# Patient Record
Sex: Female | Born: 1970
Health system: Southern US, Community
[De-identification: ages and names within clinical notes are randomized; demographics above are authoritative.]

## PROBLEM LIST (undated history)

## (undated) DIAGNOSIS — N83209 Unspecified ovarian cyst, unspecified side: Secondary | ICD-10-CM

## (undated) DIAGNOSIS — N809 Endometriosis, unspecified: Secondary | ICD-10-CM

## (undated) DIAGNOSIS — IMO0002 Reserved for concepts with insufficient information to code with codable children: Secondary | ICD-10-CM

## (undated) DIAGNOSIS — D219 Benign neoplasm of connective and other soft tissue, unspecified: Secondary | ICD-10-CM

## (undated) DIAGNOSIS — D649 Anemia, unspecified: Secondary | ICD-10-CM

## (undated) DIAGNOSIS — R87619 Unspecified abnormal cytological findings in specimens from cervix uteri: Secondary | ICD-10-CM

## (undated) DIAGNOSIS — I10 Essential (primary) hypertension: Secondary | ICD-10-CM

## (undated) HISTORY — PX: WISDOM TOOTH EXTRACTION: SHX21

## (undated) HISTORY — DX: Essential (primary) hypertension: I10

## (undated) HISTORY — PX: HYSTERECTOMY, TOTAL, LAPAROSCOPIC, ROBOT-ASSISTED WITH SALPINGECTOMY: SHX7587

---

## 2003-12-01 HISTORY — PX: DIAGNOSTIC LAPAROSCOPY: SUR761

## 2004-06-30 HISTORY — PX: OTHER SURGICAL HISTORY: SHX169

## 2006-09-29 ENCOUNTER — Encounter: Admission: RE | Admit: 2006-09-29 | Discharge: 2006-12-28 | Payer: Self-pay | Admitting: Obstetrics and Gynecology

## 2007-07-07 ENCOUNTER — Ambulatory Visit (HOSPITAL_COMMUNITY): Admission: RE | Admit: 2007-07-07 | Discharge: 2007-07-07 | Payer: Self-pay | Admitting: Obstetrics and Gynecology

## 2012-08-23 ENCOUNTER — Other Ambulatory Visit: Payer: Self-pay | Admitting: Obstetrics and Gynecology

## 2012-08-23 DIAGNOSIS — Z1231 Encounter for screening mammogram for malignant neoplasm of breast: Secondary | ICD-10-CM

## 2012-09-14 ENCOUNTER — Ambulatory Visit: Payer: Self-pay

## 2012-11-18 ENCOUNTER — Ambulatory Visit
Admission: RE | Admit: 2012-11-18 | Discharge: 2012-11-18 | Disposition: A | Discharge status: Home / Self Care | Payer: Self-pay | Source: Ambulatory Visit | Attending: Obstetrics and Gynecology | Admitting: Obstetrics and Gynecology

## 2012-11-18 DIAGNOSIS — Z1231 Encounter for screening mammogram for malignant neoplasm of breast: Secondary | ICD-10-CM

## 2013-07-23 ENCOUNTER — Emergency Department (HOSPITAL_COMMUNITY): Payer: Commercial Managed Care - PPO

## 2013-07-23 ENCOUNTER — Emergency Department (HOSPITAL_COMMUNITY)
Admission: EM | Admit: 2013-07-23 | Discharge: 2013-07-24 | Disposition: A | Discharge status: Home / Self Care | Payer: Commercial Managed Care - PPO | Attending: Emergency Medicine | Admitting: Emergency Medicine

## 2013-07-23 ENCOUNTER — Encounter (HOSPITAL_COMMUNITY): Payer: Self-pay | Admitting: *Deleted

## 2013-07-23 DIAGNOSIS — D219 Benign neoplasm of connective and other soft tissue, unspecified: Secondary | ICD-10-CM

## 2013-07-23 DIAGNOSIS — D259 Leiomyoma of uterus, unspecified: Secondary | ICD-10-CM | POA: Insufficient documentation

## 2013-07-23 HISTORY — DX: Unspecified abnormal cytological findings in specimens from cervix uteri: R87.619

## 2013-07-23 HISTORY — DX: Reserved for concepts with insufficient information to code with codable children: IMO0002

## 2013-07-23 LAB — CBC WITH DIFFERENTIAL/PLATELET
Basophils Absolute: 0 10*3/uL (ref 0.0–0.1)
Basophils Relative: 0 % (ref 0–1)
Eosinophils Absolute: 0.1 10*3/uL (ref 0.0–0.7)
Eosinophils Relative: 1 % (ref 0–5)
HCT: 29 % — ABNORMAL LOW (ref 36.0–46.0)
Hemoglobin: 8.4 g/dL — ABNORMAL LOW (ref 12.0–15.0)
Lymphocytes Relative: 10 % — ABNORMAL LOW (ref 12–46)
Lymphs Abs: 1 10*3/uL (ref 0.7–4.0)
MCH: 18.7 pg — ABNORMAL LOW (ref 26.0–34.0)
MCHC: 29 g/dL — ABNORMAL LOW (ref 30.0–36.0)
MCV: 64.4 fL — ABNORMAL LOW (ref 78.0–100.0)
Monocytes Absolute: 0.6 10*3/uL (ref 0.1–1.0)
Monocytes Relative: 6 % (ref 3–12)
Neutro Abs: 8.5 10*3/uL — ABNORMAL HIGH (ref 1.7–7.7)
Neutrophils Relative %: 83 % — ABNORMAL HIGH (ref 43–77)
Platelets: 274 10*3/uL (ref 150–400)
RBC: 4.5 MIL/uL (ref 3.87–5.11)
RDW: 25.2 % — ABNORMAL HIGH (ref 11.5–15.5)
WBC: 10.2 10*3/uL (ref 4.0–10.5)

## 2013-07-23 LAB — COMPREHENSIVE METABOLIC PANEL
ALT: 138 U/L — ABNORMAL HIGH (ref 0–35)
AST: 149 U/L — ABNORMAL HIGH (ref 0–37)
Albumin: 3.7 g/dL (ref 3.5–5.2)
Alkaline Phosphatase: 87 U/L (ref 39–117)
BUN: 8 mg/dL (ref 6–23)
CO2: 25 mEq/L (ref 19–32)
Calcium: 8.9 mg/dL (ref 8.4–10.5)
Chloride: 105 mEq/L (ref 96–112)
Creatinine, Ser: 0.59 mg/dL (ref 0.50–1.10)
GFR calc Af Amer: >90 mL/min (ref >90)
GFR calc non Af Amer: >90 mL/min (ref >90)
Glucose, Bld: 87 mg/dL (ref 70–99)
Potassium: 3.1 mEq/L — ABNORMAL LOW (ref 3.5–5.1)
Sodium: 139 mEq/L (ref 135–145)
Total Bilirubin: 0.7 mg/dL (ref 0.3–1.2)
Total Protein: 6.9 g/dL (ref 6.0–8.3)

## 2013-07-23 LAB — URINALYSIS, ROUTINE W REFLEX MICROSCOPIC
Bilirubin Urine: NEGATIVE
Glucose, UA: NEGATIVE mg/dL
Ketones, ur: NEGATIVE mg/dL
Leukocytes, UA: NEGATIVE
Nitrite: NEGATIVE
Protein, ur: 30 mg/dL — AB
Specific Gravity, Urine: 1.024 (ref 1.005–1.030)
Urobilinogen, UA: 4 mg/dL — ABNORMAL HIGH (ref 0.0–1.0)
pH: 8 (ref 5.0–8.0)

## 2013-07-23 LAB — URINE MICROSCOPIC-ADD ON

## 2013-07-23 MED ORDER — ONDANSETRON HCL 4 MG/2ML IJ SOLN
4.0000 mg | Freq: Once | INTRAMUSCULAR | Status: AC
  Administered 2013-07-23: 4 mg via INTRAVENOUS
  Filled 2013-07-23: qty 2

## 2013-07-23 MED ORDER — SODIUM CHLORIDE 0.9 % IV BOLUS (SEPSIS)
1000.0000 mL | Freq: Once | INTRAVENOUS | Status: AC
  Administered 2013-07-23: 1000 mL via INTRAVENOUS

## 2013-07-23 MED ORDER — MORPHINE SULFATE 4 MG/ML IJ SOLN
4.0000 mg | Freq: Once | INTRAMUSCULAR | Status: AC
  Administered 2013-07-23: 4 mg via INTRAVENOUS
  Filled 2013-07-23: qty 1

## 2013-07-23 NOTE — ED Notes (Signed)
Pt states that she has fibriods and when her cycle is starting she has abdominal pain. Pt is on cycle now. Pt states that the abdominal pain is more severe and generalized. Pt states she is usually nauseated and vomits on her cycle as well, which has been occuring with this cycle.

## 2013-07-23 NOTE — ED Provider Notes (Signed)
CSN: [id number]     Arrival date & time 07/23/13  2006 History     First MD Initiated Contact with Patient 07/23/13 2135     Chief Complaint  Patient presents with  . Abdominal Pain   (Consider location/radiation/quality/duration/timing/severity/associated sxs/prior Treatment) HPI Comments: Patient presents emergency department with chief complaint of abdominal pain. She states that she has uterine fibroids, which caused her to have a lot of abdominal pain, but states that this month it is much much worse than normal. She states that she has had large menstruation, and uses 16-18 pads per day. States that the pain is moderate to severe. She states that she is usually anemic. He states it feels that there is a ball in her lower abdomen. Nothing makes her symptoms better or worse.  The history is provided by the patient. No language interpreter was used.    Past Medical History  Diagnosis Date  . Fibroid   . Abnormal Pap smear    History reviewed. No pertinent past surgical history. History reviewed. No pertinent family history. History  Substance Use Topics  . Smoking status: Never Smoker   . Smokeless tobacco: Not on file  . Alcohol Use: No   OB History   Grav Para Term Preterm Abortions TAB SAB Ect Mult Living                 Review of Systems  All other systems reviewed and are negative.    Allergies  Review of patient's allergies indicates no known allergies.  Home Medications  No current outpatient prescriptions on file. BP 122/82  Pulse 117  Temp(Src) 98.7 F (37.1 C) (Oral)  Resp 16  SpO2 100% Physical Exam  Nursing note and vitals reviewed. Constitutional: She is oriented to person, place, and time. She appears well-developed and well-nourished.  HENT:  Head: Normocephalic and atraumatic.  Eyes: Conjunctivae and EOM are normal. Pupils are equal, round, and reactive to light.  Neck: Normal range of motion. Neck supple.  Cardiovascular: Normal rate and  regular rhythm.  Exam reveals no gallop and no friction rub.   No murmur heard. Pulmonary/Chest: Effort normal and breath sounds normal. No respiratory distress. She has no wheezes. She has no rales. She exhibits no tenderness.  Abdominal: Soft. Bowel sounds are normal. She exhibits no distension and no mass. There is no tenderness. There is no rebound and no guarding. Hernia confirmed negative in the right inguinal area and confirmed negative in the left inguinal area.  Genitourinary: No labial fusion. There is no rash, tenderness, lesion or injury on the right labia. There is no rash, tenderness, lesion or injury on the left labia. Uterus is tender. Uterus is not deviated, not enlarged and not fixed. Cervix exhibits no motion tenderness, no discharge and no friability. Right adnexum displays no mass, no tenderness and no fullness. Left adnexum displays no mass, no tenderness and no fullness. No erythema, tenderness or bleeding around the vagina. No foreign body around the vagina. No signs of injury around the vagina. No vaginal discharge found.  Chaperone was present during exam. Moderate amount of dark red blood, no evidence of hemorrhage   Musculoskeletal: Normal range of motion. She exhibits no edema and no tenderness.  Lymphadenopathy:       Right: No inguinal adenopathy present.       Left: No inguinal adenopathy present.  Neurological: She is alert and oriented to person, place, and time.  Skin: Skin is warm and dry.  Psychiatric: She  has a normal mood and affect. Her behavior is normal. Judgment and thought content normal.    ED Course   Procedures (including critical care time)  Labs Reviewed  CBC WITH DIFFERENTIAL - Abnormal; Notable for the following:    Hemoglobin 8.4 (*)    HCT 29.0 (*)    MCV 64.4 (*)    MCH 18.7 (*)    MCHC 29.0 (*)    RDW 25.2 (*)    Neutrophils Relative % 83 (*)    Lymphocytes Relative 10 (*)    Neutro Abs 8.5 (*)    All other components within normal  limits  COMPREHENSIVE METABOLIC PANEL - Abnormal; Notable for the following:    Potassium 3.1 (*)    AST 149 (*)    ALT 138 (*)    All other components within normal limits  WET PREP, GENITAL  GC/CHLAMYDIA PROBE AMP  URINALYSIS, ROUTINE W REFLEX MICROSCOPIC   Results for orders placed during the hospital encounter of 07/23/13  WET PREP, GENITAL      Result Value Range   Yeast Wet Prep HPF POC NONE SEEN  NONE SEEN   Trich, Wet Prep NONE SEEN  NONE SEEN   Clue Cells Wet Prep HPF POC FEW (*) NONE SEEN   WBC, Wet Prep HPF POC FEW (*) NONE SEEN  CBC WITH DIFFERENTIAL      Result Value Range   WBC 10.2  4.0 - 10.5 K/uL   RBC 4.50  3.87 - 5.11 MIL/uL   Hemoglobin 8.4 (*) 12.0 - 15.0 g/dL   HCT 16.1 (*) 09.6 - 04.5 %   MCV 64.4 (*) 78.0 - 100.0 fL   MCH 18.7 (*) 26.0 - 34.0 pg   MCHC 29.0 (*) 30.0 - 36.0 g/dL   RDW 40.9 (*) 81.1 - 91.4 %   Platelets 274  150 - 400 K/uL   Neutrophils Relative % 83 (*) 43 - 77 %   Lymphocytes Relative 10 (*) 12 - 46 %   Monocytes Relative 6  3 - 12 %   Eosinophils Relative 1  0 - 5 %   Basophils Relative 0  0 - 1 %   Neutro Abs 8.5 (*) 1.7 - 7.7 K/uL   Lymphs Abs 1.0  0.7 - 4.0 K/uL   Monocytes Absolute 0.6  0.1 - 1.0 K/uL   Eosinophils Absolute 0.1  0.0 - 0.7 K/uL   Basophils Absolute 0.0  0.0 - 0.1 K/uL   RBC Morphology ELLIPTOCYTES    COMPREHENSIVE METABOLIC PANEL      Result Value Range   Sodium 139  135 - 145 mEq/L   Potassium 3.1 (*) 3.5 - 5.1 mEq/L   Chloride 105  96 - 112 mEq/L   CO2 25  19 - 32 mEq/L   Glucose, Bld 87  70 - 99 mg/dL   BUN 8  6 - 23 mg/dL   Creatinine, Ser 7.82  0.50 - 1.10 mg/dL   Calcium 8.9  8.4 - 95.6 mg/dL   Total Protein 6.9  6.0 - 8.3 g/dL   Albumin 3.7  3.5 - 5.2 g/dL   AST 213 (*) 0 - 37 U/L   ALT 138 (*) 0 - 35 U/L   Alkaline Phosphatase 87  39 - 117 U/L   Total Bilirubin 0.7  0.3 - 1.2 mg/dL   GFR calc non Af Amer >90  >90 mL/min   GFR calc Af Amer >90  >90 mL/min  URINALYSIS, ROUTINE W REFLEX  MICROSCOPIC  Result Value Range   Color, Urine YELLOW  YELLOW   APPearance CLOUDY (*) CLEAR   Specific Gravity, Urine 1.024  1.005 - 1.030   pH 8.0  5.0 - 8.0   Glucose, UA NEGATIVE  NEGATIVE mg/dL   Hgb urine dipstick LARGE (*) NEGATIVE   Bilirubin Urine NEGATIVE  NEGATIVE   Ketones, ur NEGATIVE  NEGATIVE mg/dL   Protein, ur 30 (*) NEGATIVE mg/dL   Urobilinogen, UA 4.0 (*) 0.0 - 1.0 mg/dL   Nitrite NEGATIVE  NEGATIVE   Leukocytes, UA NEGATIVE  NEGATIVE  URINE MICROSCOPIC-ADD ON      Result Value Range   Squamous Epithelial / LPF MANY (*) RARE   WBC, UA 3-6  <3 WBC/hpf   RBC / HPF 3-6  <3 RBC/hpf   Bacteria, UA MANY (*) RARE   Casts HYALINE CASTS (*) NEGATIVE   Urine-Other MUCOUS PRESENT     US Transvaginal Non-ob  07/24/2013   *RADIOLOGY REPORT*  Clinical Data: Pelvic pain  TRANSABDOMINAL AND TRANSVAGINAL ULTRASOUND OF PELVIS Technique:  Both transabdominal and transvaginal ultrasound examinations of the pelvis were performed. Transabdominal technique was performed for global imaging of the pelvis including uterus, ovaries, adnexal regions, and pelvic cul-de-sac.  It was necessary to proceed with endovaginal exam following the transabdominal exam to visualize the endometrium and adnexa.  Comparison:  None  Findings:  Uterus:  Mildly enlarged at 10.0 x 6.6 x 8.8 cm.  There are multiple fibroids, the largest of which is exophytic from the posterior margin measuring 4.3 x 3.5 x 4.1 cm.  There are several intramural fibroids.  No submucosal fibroid visualized.  Endometrium: Debris/fluid within the endometrium.  Thickness measures up to 8 mm.  Right ovary:  Measures 5.3 x 3.1 x 4.4 cm.  The dominant cyst measuring 2.5 x 3.8 cm is mildly complex with internal hypoechoic fluid and echoes and hematocrit level on image 76/88.  Left ovary: Measures 4.7 x 3.3 x 3.5 cm by transabdominal technique.  Not visualized by transvaginal technique.  Other findings: Small amount of free fluid, predominately  within the right adnexa.  IMPRESSION: Fibroid uterus.  Probable hemorrhagic cyst right ovary. Small amount of free fluid predominately collects within the right adnexa, therefore may reflect cyst rupture.   Original Report Authenticated By: Jearld Lesch, M.D.   US Pelvis Complete  07/24/2013   *RADIOLOGY REPORT*  Clinical Data: Pelvic pain  TRANSABDOMINAL AND TRANSVAGINAL ULTRASOUND OF PELVIS Technique:  Both transabdominal and transvaginal ultrasound examinations of the pelvis were performed. Transabdominal technique was performed for global imaging of the pelvis including uterus, ovaries, adnexal regions, and pelvic cul-de-sac.  It was necessary to proceed with endovaginal exam following the transabdominal exam to visualize the endometrium and adnexa.  Comparison:  None  Findings:  Uterus:  Mildly enlarged at 10.0 x 6.6 x 8.8 cm.  There are multiple fibroids, the largest of which is exophytic from the posterior margin measuring 4.3 x 3.5 x 4.1 cm.  There are several intramural fibroids.  No submucosal fibroid visualized.  Endometrium: Debris/fluid within the endometrium.  Thickness measures up to 8 mm.  Right ovary:  Measures 5.3 x 3.1 x 4.4 cm.  The dominant cyst measuring 2.5 x 3.8 cm is mildly complex with internal hypoechoic fluid and echoes and hematocrit level on image 76/88.  Left ovary: Measures 4.7 x 3.3 x 3.5 cm by transabdominal technique.  Not visualized by transvaginal technique.  Other findings: Small amount of free fluid, predominately within the right adnexa.  IMPRESSION: Fibroid uterus.  Probable hemorrhagic cyst right ovary. Small amount of free fluid predominately collects within the right adnexa, therefore may reflect cyst rupture.   Original Report Authenticated By: Jearld Lesch, M.D.     No results found. 1. Fibroids     MDM  Patient with heavy menstrual bleeding, and abdominal pain. She has a history of uterine fibroids. Will order basic labs, we'll perform pelvic, and  will reevaluate.  Patient discussed with Dr. Micheline Maze.  Pelvic exam reveals moderate dark red blood, no evidence of hemorrhage or active bleeding. Patient is moderately tender to the middle low abdomen. Suspect that the symptoms are stemming from the uterine fibroids. Will order ultrasound to evaluate further.  Patient states that the pain is well controlled.  Plan is to discharge the patient pending [place].  Patient is not dizzy.  She states that she suffers from chronic anemia.  Recommend iron supplement.  Patient will follow-up with her OBGYN.  The patient understands and agrees with the plan.    Workup and treatment discussed with Dr. Micheline Maze, who agrees with the plan.  Patient does not want to try OCPs at this time, but will ask OBGYN about it.  1:19 AM Discussed [place] results with Dr. Shela Commons, who recommends NSAIDs and OBGYN follow-up.  Roxy Horseman, PA-C 07/24/13 0110  Roxy Horseman, PA-C 07/24/13 0120

## 2013-07-24 LAB — WET PREP, GENITAL
Trich, Wet Prep: NONE SEEN
Yeast Wet Prep HPF POC: NONE SEEN

## 2013-07-24 MED ORDER — FERROUS SULFATE 325 (65 FE) MG PO TABS: 325.0000 mg | ORAL_TABLET | Freq: Every day | ORAL | Status: DC

## 2013-07-24 MED ORDER — HYDROCODONE-ACETAMINOPHEN 5-325 MG PO TABS: 1.0000 | ORAL_TABLET | Freq: Four times a day (QID) | ORAL | Status: DC | PRN

## 2013-07-24 MED ORDER — MORPHINE SULFATE 4 MG/ML IJ SOLN
4.0000 mg | Freq: Once | INTRAMUSCULAR | Status: AC
  Administered 2013-07-24: 4 mg via INTRAVENOUS
  Filled 2013-07-24: qty 1

## 2013-07-24 MED ORDER — IBUPROFEN 600 MG PO TABS: 600.0000 mg | ORAL_TABLET | Freq: Four times a day (QID) | ORAL | Status: DC | PRN

## 2013-07-24 MED ORDER — SODIUM CHLORIDE 0.9 % IV BOLUS (SEPSIS)
1000.0000 mL | Freq: Once | INTRAVENOUS | Status: AC
  Administered 2013-07-24: 1000 mL via INTRAVENOUS

## 2013-07-24 NOTE — ED Provider Notes (Signed)
Medical screening examination/treatment/procedure(s) were performed by non-physician practitioner and as supervising physician I was immediately available for consultation/collaboration.   Shanna Cisco, MD 07/24/13 1139

## 2013-07-25 LAB — URINE CULTURE: Colony Count: 30000

## 2013-07-25 LAB — GC/CHLAMYDIA PROBE AMP
CT Probe RNA: NEGATIVE
GC Probe RNA: NEGATIVE

## 2013-11-03 ENCOUNTER — Other Ambulatory Visit: Payer: Self-pay | Admitting: Obstetrics and Gynecology

## 2013-11-04 ENCOUNTER — Emergency Department (HOSPITAL_COMMUNITY)
Admission: EM | Admit: 2013-11-04 | Discharge: 2013-11-05 | Disposition: A | Discharge status: Home / Self Care | Payer: Commercial Managed Care - PPO | Attending: Emergency Medicine | Admitting: Emergency Medicine

## 2013-11-04 ENCOUNTER — Encounter (HOSPITAL_COMMUNITY): Payer: Self-pay | Admitting: Emergency Medicine

## 2013-11-04 DIAGNOSIS — R42 Dizziness and giddiness: Secondary | ICD-10-CM | POA: Insufficient documentation

## 2013-11-04 DIAGNOSIS — D259 Leiomyoma of uterus, unspecified: Secondary | ICD-10-CM | POA: Insufficient documentation

## 2013-11-04 DIAGNOSIS — D219 Benign neoplasm of connective and other soft tissue, unspecified: Secondary | ICD-10-CM

## 2013-11-04 DIAGNOSIS — Z3202 Encounter for pregnancy test, result negative: Secondary | ICD-10-CM | POA: Insufficient documentation

## 2013-11-04 DIAGNOSIS — R112 Nausea with vomiting, unspecified: Secondary | ICD-10-CM | POA: Insufficient documentation

## 2013-11-04 DIAGNOSIS — N83209 Unspecified ovarian cyst, unspecified side: Secondary | ICD-10-CM

## 2013-11-04 LAB — COMPREHENSIVE METABOLIC PANEL
ALT: 28 U/L (ref 0–35)
AST: 29 U/L (ref 0–37)
Albumin: 3.8 g/dL (ref 3.5–5.2)
Alkaline Phosphatase: 82 U/L (ref 39–117)
BUN: 8 mg/dL (ref 6–23)
CO2: 24 mEq/L (ref 19–32)
Calcium: 8.7 mg/dL (ref 8.4–10.5)
Chloride: 103 mEq/L (ref 96–112)
Creatinine, Ser: 0.57 mg/dL (ref 0.50–1.10)
GFR calc Af Amer: >90 mL/min (ref >90)
GFR calc non Af Amer: >90 mL/min (ref >90)
Glucose, Bld: 109 mg/dL — ABNORMAL HIGH (ref 70–99)
Potassium: 3.2 mEq/L — ABNORMAL LOW (ref 3.5–5.1)
Sodium: 138 mEq/L (ref 135–145)
Total Bilirubin: 0.3 mg/dL (ref 0.3–1.2)
Total Protein: 7.2 g/dL (ref 6.0–8.3)

## 2013-11-04 LAB — POCT PREGNANCY, URINE: Preg Test, Ur: NEGATIVE

## 2013-11-04 MED ORDER — MORPHINE SULFATE 4 MG/ML IJ SOLN
4.0000 mg | Freq: Once | INTRAMUSCULAR | Status: AC
  Administered 2013-11-05: 4 mg via INTRAVENOUS
  Filled 2013-11-04: qty 1

## 2013-11-04 MED ORDER — ONDANSETRON 4 MG PO TBDP
8.0000 mg | ORAL_TABLET | Freq: Once | ORAL | Status: AC
  Administered 2013-11-04: 8 mg via ORAL
  Filled 2013-11-04: qty 2

## 2013-11-04 MED ORDER — SODIUM CHLORIDE 0.9 % IV BOLUS (SEPSIS)
1000.0000 mL | Freq: Once | INTRAVENOUS | Status: AC
  Administered 2013-11-05: 1000 mL via INTRAVENOUS

## 2013-11-04 NOTE — ED Notes (Addendum)
C/o abd pain, also nv. Onset 1200 today. Has taken advil 400mg  and tylenol 1000mg  at 2030. Denies sx other than pain and nv. Last emesis PTA. Last BM today (normal), last ate 1500 (vomited). LMP onset today. Mentions has an ovarian cyst (?side). Has appt for 12/30 to deal with her fibroids/cysts.

## 2013-11-05 ENCOUNTER — Emergency Department (HOSPITAL_COMMUNITY): Payer: Commercial Managed Care - PPO

## 2013-11-05 LAB — URINALYSIS, ROUTINE W REFLEX MICROSCOPIC
Bilirubin Urine: NEGATIVE
Glucose, UA: NEGATIVE mg/dL
Ketones, ur: 40 mg/dL — AB
Nitrite: NEGATIVE
Protein, ur: NEGATIVE mg/dL
Specific Gravity, Urine: 1.022 (ref 1.005–1.030)
Urobilinogen, UA: 1 mg/dL (ref 0.0–1.0)
pH: 7.5 (ref 5.0–8.0)

## 2013-11-05 LAB — CBC WITH DIFFERENTIAL/PLATELET
Basophils Absolute: 0 10*3/uL (ref 0.0–0.1)
Basophils Relative: 0 % (ref 0–1)
Eosinophils Absolute: 0 10*3/uL (ref 0.0–0.7)
Eosinophils Relative: 0 % (ref 0–5)
HCT: 28.7 % — ABNORMAL LOW (ref 36.0–46.0)
Hemoglobin: 8.3 g/dL — ABNORMAL LOW (ref 12.0–15.0)
Lymphocytes Relative: 12 % (ref 12–46)
Lymphs Abs: 1.1 10*3/uL (ref 0.7–4.0)
MCH: 18.8 pg — ABNORMAL LOW (ref 26.0–34.0)
MCHC: 28.9 g/dL — ABNORMAL LOW (ref 30.0–36.0)
MCV: 65.1 fL — ABNORMAL LOW (ref 78.0–100.0)
Monocytes Absolute: 0.3 10*3/uL (ref 0.1–1.0)
Monocytes Relative: 3 % (ref 3–12)
Neutro Abs: 8.1 10*3/uL — ABNORMAL HIGH (ref 1.7–7.7)
Neutrophils Relative %: 85 % — ABNORMAL HIGH (ref 43–77)
Platelets: 275 10*3/uL (ref 150–400)
RBC: 4.41 MIL/uL (ref 3.87–5.11)
RDW: 22.3 % — ABNORMAL HIGH (ref 11.5–15.5)
WBC: 9.5 10*3/uL (ref 4.0–10.5)

## 2013-11-05 LAB — URINE MICROSCOPIC-ADD ON

## 2013-11-05 MED ORDER — HYDROCODONE-ACETAMINOPHEN 5-325 MG PO TABS: 1.0000 | ORAL_TABLET | ORAL | Status: DC | PRN

## 2013-11-05 MED ORDER — ONDANSETRON 4 MG PO TBDP: ORAL_TABLET | ORAL | Status: DC

## 2013-11-05 NOTE — ED Notes (Signed)
Pt taken to ultrasound

## 2013-11-05 NOTE — ED Notes (Signed)
Pt returned from Ultrasound.

## 2013-11-05 NOTE — ED Provider Notes (Signed)
CSN: [id number]     Arrival date & time 11/04/13  2140 History   First MD Initiated Contact with Patient 11/04/13 2335     Chief Complaint  Patient presents with  . Emesis  . Abdominal Pain   (Consider location/radiation/quality/duration/timing/severity/associated sxs/prior Treatment) HPI Patient with known uterine fibroids and dysmenorrhea presents on day 2 of her cycle with lower normal pain, nausea and persistent vomiting. She states this is similar to previous episodes. Just wanted to see her OB/GYN on the 30th of this month for fibroidectomy. She denies any fevers or chills. She denies any change in her bowel habits. She admits to heavy menses which is her norm. She states she has had some lightheadedness especially when standing and sitting up. She denies any chest pain or shortness of breath. Past Medical History  Diagnosis Date  . Fibroid   . Abnormal Pap smear    History reviewed. No pertinent past surgical history. History reviewed. No pertinent family history. History  Substance Use Topics  . Smoking status: Never Smoker   . Smokeless tobacco: Not on file  . Alcohol Use: No   OB History   Grav Para Term Preterm Abortions TAB SAB Ect Mult Living                 Review of Systems  Constitutional: Negative for fever and chills.  Respiratory: Negative for shortness of breath.   Cardiovascular: Negative for chest pain, palpitations and leg swelling.  Gastrointestinal: Positive for nausea, vomiting and abdominal pain. Negative for diarrhea, constipation and blood in stool.  Genitourinary: Positive for vaginal bleeding and pelvic pain. Negative for dysuria, flank pain and vaginal discharge.  Musculoskeletal: Negative for back pain, myalgias, neck pain and neck stiffness.  Skin: Negative for rash and wound.  Neurological: Positive for dizziness and light-headedness. Negative for syncope, weakness, numbness and headaches.    Allergies  Review of patient's allergies  indicates no known allergies.  Home Medications   Current Outpatient Rx  Name  Route  Sig  Dispense  Refill  . acetaminophen (TYLENOL) 500 MG tablet   Oral   Take 500 mg by mouth every 6 (six) hours as needed for pain.         Marland Kitchen ibuprofen (ADVIL,MOTRIN) 200 MG tablet   Oral   Take 200 mg by mouth every 6 (six) hours as needed for pain.          BP 132/73  Pulse 84  Temp(Src) 98.4 F (36.9 C) (Oral)  Resp 13  Ht 5\' 6"  (1.676 m)  Wt 140 lb (63.504 kg)  BMI 22.61 kg/m2  SpO2 100%  LMP 11/04/2013 Physical Exam  Nursing note and vitals reviewed. Constitutional: She is oriented to person, place, and time. She appears well-developed and well-nourished. No distress.  HENT:  Head: Normocephalic and atraumatic.  Mouth/Throat: Oropharynx is clear and moist.  Eyes: EOM are normal. Pupils are equal, round, and reactive to light.  Neck: Normal range of motion. Neck supple.  Cardiovascular: Normal rate and regular rhythm.   Pulmonary/Chest: Effort normal and breath sounds normal. No respiratory distress. She has no wheezes. She has no rales. She exhibits no tenderness.  Abdominal: Soft. Bowel sounds are normal. She exhibits no distension. There is tenderness (tenderness to palpation over the patient's bilateral lower abdomen. No rebound guarding.). There is no rebound and no guarding.  Musculoskeletal: Normal range of motion. She exhibits no edema and no tenderness.  No CVA tenderness bilaterally.  Neurological: She is alert  and oriented to person, place, and time.  Skin: Skin is warm and dry. No rash noted. No erythema.  Psychiatric: She has a normal mood and affect. Her behavior is normal.    ED Course  Procedures (including critical care time) Labs Review Labs Reviewed  COMPREHENSIVE METABOLIC PANEL - Abnormal; Notable for the following:    Potassium 3.2 (*)    Glucose, Bld 109 (*)    All other components within normal limits  CBC WITH DIFFERENTIAL - Abnormal; Notable for  the following:    Hemoglobin 8.3 (*)    HCT 28.7 (*)    MCV 65.1 (*)    MCH 18.8 (*)    MCHC 28.9 (*)    RDW 22.3 (*)    Neutrophils Relative % 85 (*)    Neutro Abs 8.1 (*)    All other components within normal limits  URINALYSIS, ROUTINE W REFLEX MICROSCOPIC  POCT PREGNANCY, URINE   Imaging Review No results found.  EKG Interpretation   None       MDM  Patient's symptoms have completely resolved. Her ultrasound demonstrates multiple fibroids the uterus and she has a hemorrhagic right ovarian cyst. She has no evidence of torsion. Patient is advised to followup with her OB/GYN. She's been given return precautions to include fever, worsening pain, persistent vomiting, worsening vaginal bleeding or for any concerns. Will discharge with pain medication and antiemetics    Loren Racer, MD 11/05/13 [phone]

## 2013-11-06 LAB — URINE CULTURE: Colony Count: 75000

## 2013-11-20 ENCOUNTER — Encounter (HOSPITAL_COMMUNITY): Payer: Self-pay | Admitting: Pharmacist

## 2013-11-21 ENCOUNTER — Encounter (HOSPITAL_COMMUNITY): Payer: Self-pay

## 2013-11-21 ENCOUNTER — Encounter (HOSPITAL_COMMUNITY)
Admission: RE | Admit: 2013-11-21 | Discharge: 2013-11-21 | Disposition: A | Discharge status: Home / Self Care | Payer: Commercial Managed Care - PPO | Source: Ambulatory Visit | Attending: Obstetrics and Gynecology | Admitting: Obstetrics and Gynecology

## 2013-11-21 DIAGNOSIS — Z01812 Encounter for preprocedural laboratory examination: Secondary | ICD-10-CM | POA: Insufficient documentation

## 2013-11-21 HISTORY — DX: Anemia, unspecified: D64.9

## 2013-11-21 HISTORY — DX: Benign neoplasm of connective and other soft tissue, unspecified: D21.9

## 2013-11-21 LAB — CBC
HCT: 26.8 % — ABNORMAL LOW (ref 36.0–46.0)
Hemoglobin: 7.9 g/dL — ABNORMAL LOW (ref 12.0–15.0)
MCH: 18.2 pg — ABNORMAL LOW (ref 26.0–34.0)
MCHC: 29.5 g/dL — ABNORMAL LOW (ref 30.0–36.0)
MCV: 61.8 fL — ABNORMAL LOW (ref 78.0–100.0)
Platelets: 335 10*3/uL (ref 150–400)
RBC: 4.34 MIL/uL (ref 3.87–5.11)
RDW: 22.5 % — ABNORMAL HIGH (ref 11.5–15.5)
WBC: 5.1 10*3/uL (ref 4.0–10.5)

## 2013-11-21 NOTE — Pre-Procedure Instructions (Signed)
Informed Dr Cristela Blue of patient's hgb of 7.9 at today's PAT appt.  Reviewed medical history and medications.  No orders given.  Ok for surgery.

## 2013-11-21 NOTE — Patient Instructions (Addendum)
Your procedure is scheduled on: 11/28/2013  Enter through the Main Entrance of [facility] at: 1130am  Pick up the phone at the desk and dial [phone].  Call this number if you have problems the morning of surgery: [phone].  Remember: Do NOT eat food: AFTER MIDNIGHT MONDAY Do NOT drink clear liquids after: AFTER 0700AM DAY OF SURGERY Take these medicines the morning of surgery with a SIP OF WATER: NONE  Do NOT wear jewelry (body piercing), make-up, or nail polish. Do NOT wear lotions, powders, or perfumes.  You may wear deoderant. Do NOT shave for 48 hours prior to surgery. Do NOT bring valuables to the hospital. Contacts, dentures, or bridgework may not be worn into surgery.  Have a responsible adult drive you home and stay with you for 24 hours after your procedure.  Home with husband Richard cell [phone].

## 2013-11-27 NOTE — H&P (Signed)
NAMEJENEFER, [name]            ACCOUNT NO.:  [id number]  MEDICAL RECORD NO.:  [id number]  LOCATION:  PERIO                         FACILITY:  [facility]  PHYSICIAN:  Lenoard Aden, M.D.DATE OF BIRTH:  1970-12-23  DATE OF ADMISSION:  10/13/2013 DATE OF DISCHARGE:                             HISTORY & PHYSICAL   CHIEF COMPLAINT:  Pelvic pain.  HISTORY OF PRESENT ILLNESS:  A 42 year old African American female, G0 with ongoing pelvic pain, dysmenorrhea, and right ovarian cyst for surgical intervention.  SURGICAL HISTORY:  Remarkable for LEEP.  ALLERGIES:  No known drug allergies.  MEDICATIONS:  Ultram as needed.  FAMILY HISTORY:  CVA and hypertension.  SOCIAL HISTORY:  She is a nonsmoker and nondrinker.  She denies domestic or physical violence.  PHYSICAL EXAMINATION:  GENERAL:  She is a well-developed, well- nourished, African American female, in no acute distress. HEENT:  Normal. NECK:  Supple.  Full range of motion. LUNGS:  Clear. HEART:  Regular rate and rhythm. ABDOMEN:  Soft, nontender. PELVIC:  Reveals a bulky, enlarged uterus, right lower quadrant pain. No adnexal masses appreciated on exam. EXTREMITIES:  There are no cords. NEUROLOGIC:  Nonfocal. SKIN:  Intact.  IMPRESSION: 1. Dysmenorrhea and menorrhagia. 2. Right lower quadrant pain with questionable right ovarian cyst. 3. Questionable signs and symptoms suggestive of endometriosis.  PLAN:  Proceed with da Vinci-assisted laparoscopic ablation, excision of endometriosis, possible right ovarian cystectomy, chromopertubation, risks of anesthesia, infection, bleeding, injury to surrounding organs, possible need for repair was discussed, delayed versus immediate complications to include bowel and bladder injury noted.  The patient acknowledges and wishes to proceed.     Lenoard Aden, M.D.     RJT/MEDQ  D:  11/27/2013  T:  11/27/2013  Job:  [id number]

## 2013-11-28 ENCOUNTER — Ambulatory Visit (HOSPITAL_COMMUNITY)
Admission: RE | Admit: 2013-11-28 | Discharge: 2013-11-28 | Disposition: A | Discharge status: Home / Self Care | Payer: Commercial Managed Care - PPO | Source: Ambulatory Visit | Attending: Obstetrics and Gynecology | Admitting: Obstetrics and Gynecology

## 2013-11-28 ENCOUNTER — Encounter (HOSPITAL_COMMUNITY): Payer: Self-pay | Admitting: *Deleted

## 2013-11-28 ENCOUNTER — Encounter (HOSPITAL_COMMUNITY)
Admission: RE | Disposition: A | Discharge status: Home / Self Care | Payer: Self-pay | Source: Ambulatory Visit | Attending: Obstetrics and Gynecology

## 2013-11-28 ENCOUNTER — Ambulatory Visit (HOSPITAL_COMMUNITY): Payer: Commercial Managed Care - PPO | Admitting: Certified Registered Nurse Anesthetist

## 2013-11-28 ENCOUNTER — Encounter (HOSPITAL_COMMUNITY): Payer: Commercial Managed Care - PPO | Admitting: Certified Registered Nurse Anesthetist

## 2013-11-28 DIAGNOSIS — N8000 Endometriosis of the uterus, unspecified: Secondary | ICD-10-CM | POA: Insufficient documentation

## 2013-11-28 DIAGNOSIS — N946 Dysmenorrhea, unspecified: Secondary | ICD-10-CM | POA: Insufficient documentation

## 2013-11-28 DIAGNOSIS — N803 Endometriosis of pelvic peritoneum, unspecified: Secondary | ICD-10-CM | POA: Insufficient documentation

## 2013-11-28 DIAGNOSIS — N80109 Endometriosis of ovary, unspecified side, unspecified depth: Secondary | ICD-10-CM | POA: Insufficient documentation

## 2013-11-28 DIAGNOSIS — N801 Endometriosis of ovary: Secondary | ICD-10-CM | POA: Insufficient documentation

## 2013-11-28 DIAGNOSIS — N83209 Unspecified ovarian cyst, unspecified side: Secondary | ICD-10-CM | POA: Insufficient documentation

## 2013-11-28 DIAGNOSIS — N809 Endometriosis, unspecified: Secondary | ICD-10-CM

## 2013-11-28 DIAGNOSIS — N736 Female pelvic peritoneal adhesions (postinfective): Secondary | ICD-10-CM | POA: Insufficient documentation

## 2013-11-28 DIAGNOSIS — N949 Unspecified condition associated with female genital organs and menstrual cycle: Secondary | ICD-10-CM | POA: Insufficient documentation

## 2013-11-28 HISTORY — PX: CHROMOPERTUBATION: SHX6288

## 2013-11-28 HISTORY — PX: ROBOTIC ASSISTED LAPAROSCOPIC LYSIS OF ADHESION: SHX6080

## 2013-11-28 LAB — TYPE AND SCREEN
ABO/RH(D): A POS
Antibody Screen: NEGATIVE

## 2013-11-28 LAB — ABO/RH: ABO/RH(D): A POS

## 2013-11-28 LAB — HCG, SERUM, QUALITATIVE: Preg, Serum: NEGATIVE

## 2013-11-28 SURGERY — ROBOTIC ASSISTED LAPAROSCOPIC LYSIS OF ADHESION
Anesthesia: General | Site: Vagina

## 2013-11-28 MED ORDER — METOCLOPRAMIDE HCL 5 MG/ML IJ SOLN: 10.0000 mg | Freq: Once | INTRAMUSCULAR | Status: DC | PRN

## 2013-11-28 MED ORDER — BUPIVACAINE HCL (PF) 0.25 % IJ SOLN
INTRAMUSCULAR | Status: DC | PRN
  Administered 2013-11-28: 9 mL

## 2013-11-28 MED ORDER — MIDAZOLAM HCL 2 MG/2ML IJ SOLN
INTRAMUSCULAR | Status: AC
  Filled 2013-11-28: qty 2

## 2013-11-28 MED ORDER — FENTANYL CITRATE 0.05 MG/ML IJ SOLN
INTRAMUSCULAR | Status: AC
  Filled 2013-11-28: qty 2

## 2013-11-28 MED ORDER — GLYCOPYRROLATE 0.2 MG/ML IJ SOLN
INTRAMUSCULAR | Status: AC
  Filled 2013-11-28: qty 3

## 2013-11-28 MED ORDER — FENTANYL CITRATE 0.05 MG/ML IJ SOLN
25.0000 ug | INTRAMUSCULAR | Status: DC | PRN
  Administered 2013-11-28 (×2): 50 ug via INTRAVENOUS

## 2013-11-28 MED ORDER — HYDROMORPHONE HCL PF 1 MG/ML IJ SOLN
INTRAMUSCULAR | Status: DC | PRN
  Administered 2013-11-28: 1 mg via INTRAVENOUS

## 2013-11-28 MED ORDER — NEOSTIGMINE METHYLSULFATE 1 MG/ML IJ SOLN
INTRAMUSCULAR | Status: AC
  Filled 2013-11-28: qty 1

## 2013-11-28 MED ORDER — PROPOFOL 10 MG/ML IV BOLUS
INTRAVENOUS | Status: DC | PRN
  Administered 2013-11-28: 120 mg via INTRAVENOUS

## 2013-11-28 MED ORDER — ACETAMINOPHEN 10 MG/ML IV SOLN
1000.0000 mg | Freq: Once | INTRAVENOUS | Status: AC
  Administered 2013-11-28: 1000 mg via INTRAVENOUS
  Filled 2013-11-28: qty 100

## 2013-11-28 MED ORDER — ONDANSETRON HCL 4 MG/2ML IJ SOLN
INTRAMUSCULAR | Status: AC
  Filled 2013-11-28: qty 2

## 2013-11-28 MED ORDER — STERILE WATER FOR IRRIGATION IR SOLN
Status: DC | PRN
  Administered 2013-11-28: 14:00:00

## 2013-11-28 MED ORDER — OXYCODONE-ACETAMINOPHEN 5-325 MG PO TABS: 1.0000 | ORAL_TABLET | ORAL | Status: DC | PRN

## 2013-11-28 MED ORDER — GLYCOPYRROLATE 0.2 MG/ML IJ SOLN
INTRAMUSCULAR | Status: DC | PRN
  Administered 2013-11-28: .4 mg via INTRAVENOUS
  Administered 2013-11-28: 0.2 mg via INTRAVENOUS

## 2013-11-28 MED ORDER — DEXAMETHASONE SODIUM PHOSPHATE 10 MG/ML IJ SOLN
INTRAMUSCULAR | Status: AC
  Filled 2013-11-28: qty 1

## 2013-11-28 MED ORDER — LIDOCAINE HCL (CARDIAC) 20 MG/ML IV SOLN
INTRAVENOUS | Status: DC | PRN
  Administered 2013-11-28: 60 mg via INTRAVENOUS

## 2013-11-28 MED ORDER — CEFAZOLIN SODIUM-DEXTROSE 2-3 GM-% IV SOLR
2.0000 g | INTRAVENOUS | Status: AC
  Administered 2013-11-28: 2 g via INTRAVENOUS

## 2013-11-28 MED ORDER — PROPOFOL 10 MG/ML IV EMUL
INTRAVENOUS | Status: AC
  Filled 2013-11-28: qty 20

## 2013-11-28 MED ORDER — DEXAMETHASONE SODIUM PHOSPHATE 10 MG/ML IJ SOLN
INTRAMUSCULAR | Status: DC | PRN
  Administered 2013-11-28: 10 mg via INTRAVENOUS

## 2013-11-28 MED ORDER — NEOSTIGMINE METHYLSULFATE 1 MG/ML IJ SOLN
INTRAMUSCULAR | Status: DC | PRN
  Administered 2013-11-28: 3 mg via INTRAVENOUS

## 2013-11-28 MED ORDER — LACTATED RINGERS IV SOLN
INTRAVENOUS | Status: DC
  Administered 2013-11-28 (×2): via INTRAVENOUS

## 2013-11-28 MED ORDER — KETOROLAC TROMETHAMINE 30 MG/ML IJ SOLN
INTRAMUSCULAR | Status: AC
  Filled 2013-11-28: qty 1

## 2013-11-28 MED ORDER — CEFAZOLIN SODIUM-DEXTROSE 2-3 GM-% IV SOLR
INTRAVENOUS | Status: AC
  Filled 2013-11-28: qty 50

## 2013-11-28 MED ORDER — FENTANYL CITRATE 0.05 MG/ML IJ SOLN
INTRAMUSCULAR | Status: AC
  Filled 2013-11-28: qty 5

## 2013-11-28 MED ORDER — LACTATED RINGERS IR SOLN
Status: DC | PRN
  Administered 2013-11-28: 3000 mL

## 2013-11-28 MED ORDER — HYDROMORPHONE HCL PF 1 MG/ML IJ SOLN
INTRAMUSCULAR | Status: AC
  Filled 2013-11-28: qty 1

## 2013-11-28 MED ORDER — METHYLENE BLUE 1 % INJ SOLN
INTRAMUSCULAR | Status: AC
  Filled 2013-11-28: qty 10

## 2013-11-28 MED ORDER — MEPERIDINE HCL 25 MG/ML IJ SOLN: 6.2500 mg | INTRAMUSCULAR | Status: DC | PRN

## 2013-11-28 MED ORDER — ROCURONIUM BROMIDE 100 MG/10ML IV SOLN
INTRAVENOUS | Status: DC | PRN
  Administered 2013-11-28: 30 mg via INTRAVENOUS
  Administered 2013-11-28: 10 mg via INTRAVENOUS

## 2013-11-28 MED ORDER — KETOROLAC TROMETHAMINE 30 MG/ML IJ SOLN
INTRAMUSCULAR | Status: DC | PRN
  Administered 2013-11-28: 30 mg via INTRAVENOUS

## 2013-11-28 MED ORDER — ONDANSETRON HCL 4 MG/2ML IJ SOLN
INTRAMUSCULAR | Status: DC | PRN
  Administered 2013-11-28 (×2): 2 mg via INTRAVENOUS

## 2013-11-28 MED ORDER — KETOROLAC TROMETHAMINE 30 MG/ML IJ SOLN: 15.0000 mg | Freq: Once | INTRAMUSCULAR | Status: DC | PRN

## 2013-11-28 MED ORDER — BUPIVACAINE HCL (PF) 0.25 % IJ SOLN
INTRAMUSCULAR | Status: AC
  Filled 2013-11-28: qty 10

## 2013-11-28 MED ORDER — MIDAZOLAM HCL 2 MG/2ML IJ SOLN
INTRAMUSCULAR | Status: DC | PRN
  Administered 2013-11-28: 2 mg via INTRAVENOUS

## 2013-11-28 MED ORDER — LIDOCAINE HCL (CARDIAC) 20 MG/ML IV SOLN
INTRAVENOUS | Status: AC
  Filled 2013-11-28: qty 5

## 2013-11-28 MED ORDER — FENTANYL CITRATE 0.05 MG/ML IJ SOLN
INTRAMUSCULAR | Status: DC | PRN
  Administered 2013-11-28 (×3): 50 ug via INTRAVENOUS
  Administered 2013-11-28: 100 ug via INTRAVENOUS

## 2013-11-28 SURGICAL SUPPLY — 78 items
BAG URINE DRAINAGE (UROLOGICAL SUPPLIES) ×4 IMPLANT
BARRIER ADHS 3X4 INTERCEED (GAUZE/BANDAGES/DRESSINGS) ×8 IMPLANT
CABLE HIGH FREQUENCY MONO STRZ (ELECTRODE) IMPLANT
CATH FOLEY 3WAY  5CC 16FR (CATHETERS) ×1
CATH FOLEY 3WAY 5CC 16FR (CATHETERS) ×3 IMPLANT
CATH ROBINSON RED A/P 16FR (CATHETERS) IMPLANT
CHLORAPREP W/TINT 26ML (MISCELLANEOUS) ×4 IMPLANT
CLOTH BEACON ORANGE TIMEOUT ST (SAFETY) ×4 IMPLANT
CONT PATH 16OZ SNAP LID 3702 (MISCELLANEOUS) ×4 IMPLANT
COVER MAYO STAND STRL (DRAPES) ×4 IMPLANT
COVER TABLE BACK 60X90 (DRAPES) ×8 IMPLANT
COVER TIP SHEARS 8 DVNC (MISCELLANEOUS) ×3 IMPLANT
COVER TIP SHEARS 8MM DA VINCI (MISCELLANEOUS) ×1
DECANTER SPIKE VIAL GLASS SM (MISCELLANEOUS) ×4 IMPLANT
DERMABOND ADVANCED (GAUZE/BANDAGES/DRESSINGS) ×1
DERMABOND ADVANCED .7 DNX12 (GAUZE/BANDAGES/DRESSINGS) ×3 IMPLANT
DRAPE HUG U DISPOSABLE (DRAPE) ×4 IMPLANT
DRAPE LG THREE QUARTER DISP (DRAPES) ×8 IMPLANT
DRAPE WARM FLUID 44X44 (DRAPE) ×4 IMPLANT
ELECT REM PT RETURN 9FT ADLT (ELECTROSURGICAL) ×4
ELECTRODE REM PT RTRN 9FT ADLT (ELECTROSURGICAL) ×3 IMPLANT
EVACUATOR SMOKE 8.L (FILTER) ×4 IMPLANT
FORCEPS CUTTING 33CM 5MM (CUTTING FORCEPS) IMPLANT
FORCEPS CUTTING 45CM 5MM (CUTTING FORCEPS) IMPLANT
GAS CARTRIDGE (MEDICAL GASES) IMPLANT
GAUZE VASELINE 3X9 (GAUZE/BANDAGES/DRESSINGS) IMPLANT
GLOVE BIO SURGEON STRL SZ7.5 (GLOVE) ×8 IMPLANT
GOWN PREVENTION PLUS LG XLONG (DISPOSABLE) ×8 IMPLANT
GOWN PREVENTION PLUS XLARGE (GOWN DISPOSABLE) ×4 IMPLANT
GOWN STRL REIN XL XLG (GOWN DISPOSABLE) ×24 IMPLANT
GYRUS RUMI II 2.5CM BLUE (DISPOSABLE)
GYRUS RUMI II 3.5CM BLUE (DISPOSABLE)
GYRUS RUMI II 4.0CM BLUE (DISPOSABLE)
KIT ACCESSORY DA VINCI DISP (KITS) ×1
KIT ACCESSORY DVNC DISP (KITS) ×3 IMPLANT
LEGGING LITHOTOMY PAIR STRL (DRAPES) ×4 IMPLANT
NEEDLE INSUFFLATION 120MM (ENDOMECHANICALS) ×4 IMPLANT
NEEDLE INSUFFLATION 14GA 150MM (NEEDLE) ×4 IMPLANT
PACK LAPAROSCOPY BASIN (CUSTOM PROCEDURE TRAY) ×4 IMPLANT
PACK LAVH (CUSTOM PROCEDURE TRAY) ×4 IMPLANT
PAD PREP 24X48 CUFFED NSTRL (MISCELLANEOUS) ×8 IMPLANT
PLUG CATH AND CAP STER (CATHETERS) ×4 IMPLANT
PROTECTOR NERVE ULNAR (MISCELLANEOUS) ×8 IMPLANT
RUMI II 3.0CM BLUE KOH-EFFICIE (DISPOSABLE) IMPLANT
RUMI II GYRUS 2.5CM BLUE (DISPOSABLE) IMPLANT
RUMI II GYRUS 3.5CM BLUE (DISPOSABLE) IMPLANT
RUMI II GYRUS 4.0CM BLUE (DISPOSABLE) IMPLANT
SET CYSTO W/LG BORE CLAMP LF (SET/KITS/TRAYS/PACK) IMPLANT
SET IRRIG TUBING LAPAROSCOPIC (IRRIGATION / IRRIGATOR) ×4 IMPLANT
SOLUTION ELECTROLUBE (MISCELLANEOUS) ×4 IMPLANT
SUT VIC AB 0 CT1 27 (SUTURE) ×2
SUT VIC AB 0 CT1 27XBRD ANBCTR (SUTURE) ×6 IMPLANT
SUT VIC AB 0 CT1 27XBRD ANTBC (SUTURE) IMPLANT
SUT VICRYL 0 UR6 27IN ABS (SUTURE) ×4 IMPLANT
SUT VICRYL 4-0 PS2 18IN ABS (SUTURE) ×8 IMPLANT
SUT VICRYL RAPIDE 4/0 PS 2 (SUTURE) ×8 IMPLANT
SYR 50ML LL SCALE MARK (SYRINGE) ×4 IMPLANT
SYRINGE 10CC LL (SYRINGE) ×4 IMPLANT
SYSTEM CONVERTIBLE TROCAR (TROCAR) IMPLANT
TAPE UMBILICAL 1/8 X36 TWILL (MISCELLANEOUS) IMPLANT
TIP UTERINE 5.1X6CM LAV DISP (MISCELLANEOUS) IMPLANT
TIP UTERINE 6.7X10CM GRN DISP (MISCELLANEOUS) ×4 IMPLANT
TIP UTERINE 6.7X6CM WHT DISP (MISCELLANEOUS) IMPLANT
TIP UTERINE 6.7X8CM BLUE DISP (MISCELLANEOUS) IMPLANT
TOWEL OR 17X24 6PK STRL BLUE (TOWEL DISPOSABLE) ×12 IMPLANT
TRAY FOLEY CATH 14FR (SET/KITS/TRAYS/PACK) ×4 IMPLANT
TROCAR BLADELESS OPT 12M 100M (ENDOMECHANICALS) IMPLANT
TROCAR DISP BLADELESS 8 DVNC (TROCAR) ×3 IMPLANT
TROCAR DISP BLADELESS 8MM (TROCAR) ×1
TROCAR OPTI TIP 5M 100M (ENDOMECHANICALS) ×4 IMPLANT
TROCAR XCEL 12X100 BLDLESS (ENDOMECHANICALS) IMPLANT
TROCAR XCEL DIL TIP R 11M (ENDOMECHANICALS) ×4 IMPLANT
TROCAR XCEL NON-BLD 5MMX100MML (ENDOMECHANICALS) ×4 IMPLANT
TROCAR XCEL OPT SLVE 5M 100M (ENDOMECHANICALS) IMPLANT
TROCAR Z-THREAD 12X150 (TROCAR) ×4 IMPLANT
TUBING FILTER THERMOFLATOR (ELECTROSURGICAL) ×4 IMPLANT
WARMER LAPAROSCOPE (MISCELLANEOUS) ×4 IMPLANT
WATER STERILE IRR 1000ML POUR (IV SOLUTION) ×12 IMPLANT

## 2013-11-28 NOTE — Anesthesia Postprocedure Evaluation (Signed)
  Anesthesia Post Note  Patient: [name]  Procedure(s) Performed: Procedure(s) (LRB): ROBOTIC ASSISTED LAPAROSCOPIC LYSIS OF ADHESION; EXCISION OF RIGHT OVARIAN CYST WALL AND MYOMECTOMY (N/A) CHROMOPERTUBATION,  (N/A)  Anesthesia type: GA  Patient location: PACU  Post pain: Pain level controlled  Post assessment: Post-op Vital signs reviewed  Last Vitals:  Filed Vitals:   11/28/13 1630  BP: 116/73  Pulse: 77  Temp: 36.2 C  Resp: 16    Post vital signs: Reviewed  Level of consciousness: sedated  Complications: No apparent anesthesia complications

## 2013-11-28 NOTE — Anesthesia Preprocedure Evaluation (Addendum)
Anesthesia Evaluation  Patient identified by MRN, date of birth, ID band Patient awake    Reviewed: Allergy & Precautions, H&P , NPO status , Patient's Chart, lab work & pertinent test results, reviewed documented beta blocker date and time   History of Anesthesia Complications Negative for: history of anesthetic complications  Airway Mallampati: III TM Distance: >3 FB Neck ROM: full    Dental  (+) Teeth Intact   Pulmonary neg pulmonary ROS,  breath sounds clear to auscultation  Pulmonary exam normal       Cardiovascular negative cardio ROS  Rhythm:regular Rate:Normal     Neuro/Psych negative neurological ROS  negative psych ROS   GI/Hepatic negative GI ROS, Neg liver ROS,   Endo/Other  negative endocrine ROS  Renal/GU negative Renal ROS  Female GU complaint     Musculoskeletal   Abdominal   Peds  Hematology  (+) Blood dyscrasia (hgb 7.9), anemia ,   Anesthesia Other Findings   Reproductive/Obstetrics negative OB ROS                          Anesthesia Physical Anesthesia Plan  ASA: II  Anesthesia Plan: General ETT   Post-op Pain Management:    Induction:   Airway Management Planned:   Additional Equipment:   Intra-op Plan:   Post-operative Plan:   Informed Consent: I have reviewed the patients History and Physical, chart, labs and discussed the procedure including the risks, benefits and alternatives for the proposed anesthesia with the patient or authorized representative who has indicated his/her understanding and acceptance.   Dental Advisory Given  Plan Discussed with: CRNA and Surgeon  Anesthesia Plan Comments:         Anesthesia Quick Evaluation

## 2013-11-28 NOTE — Progress Notes (Signed)
Patient ID: [name], female   DOB: May 01, 1971, 42 y.o.   MRN: [id number] Patient seen and examined. Consent witnessed and signed. No changes noted. Update completed. CBC    Component Value Date/Time   WBC 5.1 11/21/2013 0750   RBC 4.34 11/21/2013 0750   HGB 7.9* 11/21/2013 0750   HCT 26.8* 11/21/2013 0750   PLT 335 11/21/2013 0750   MCV 61.8* 11/21/2013 0750   MCH 18.2* 11/21/2013 0750   MCHC 29.5* 11/21/2013 0750   RDW 22.5* 11/21/2013 0750   LYMPHSABS 1.1 11/04/2013 2212   MONOABS 0.3 11/04/2013 2212   EOSABS 0.0 11/04/2013 2212   BASOSABS 0.0 11/04/2013 2212

## 2013-11-28 NOTE — Transfer of Care (Signed)
Immediate Anesthesia Transfer of Care Note  Patient: [name]  Procedure(s) Performed: Procedure(s): ROBOTIC ASSISTED LAPAROSCOPIC LYSIS OF ADHESION; EXCISION OF RIGHT OVARIAN CYST WALL AND MYOMECTOMY (N/A) CHROMOPERTUBATION,  (N/A)  Patient Location: PACU  Anesthesia Type:General  Level of Consciousness: awake, alert  and oriented  Airway & Oxygen Therapy: Patient Spontanous Breathing and Patient connected to nasal cannula oxygen  Post-op Assessment: Report given to PACU RN and Post -op Vital signs reviewed and stable  Post vital signs: stable  Complications: No apparent anesthesia complications

## 2013-11-28 NOTE — Op Note (Signed)
11/28/2013  3:43 PM  PATIENT:  [name]  42 y.o. female  PRE-OPERATIVE DIAGNOSIS:  Endometriosis   POST-OPERATIVE DIAGNOSIS:  Endometriosis Pelvic adhesions Right ovarian endometrioma Parasitic myoma  PROCEDURE:  Procedure(s): ROBOTIC ASSISTED LAPAROSCOPIC LYSIS OF ADHESION; EXCISION OF RIGHT OVARIAN CYST WALL WITH RIGHT OVARIAN CYSTECTOMY  MYOMECTOMY CHROMOPERTUBATION,  ABLATION ON ENDOMETRIOSIS  SURGEON:  Surgeon(s): Lenoard Aden, MD  ASSISTANTS: Fredric Mare, CNM   ANESTHESIA:   local and general  ESTIMATED BLOOD LOSS: * No blood loss amount entered *   DRAINS: Urinary Catheter (Foley)   LOCAL MEDICATIONS USED:  MARCAINE     SPECIMEN:  Source of Specimen:  myoma and right ovarian cyst wall  DISPOSITION OF SPECIMEN:  PATHOLOGY  COUNTS:  YES  DICTATION #: [id number]  PLAN OF CARE: dC home  PATIENT DISPOSITION:  PACU - hemodynamically stable.

## 2013-11-29 ENCOUNTER — Encounter (HOSPITAL_COMMUNITY): Payer: Self-pay | Admitting: Obstetrics and Gynecology

## 2013-11-29 NOTE — Op Note (Signed)
[name], [name]            ACCOUNT NO.:  [id number]  MEDICAL RECORD NO.:  [id number]  LOCATION:  [facility]                          FACILITY:  [facility]  PHYSICIAN:  Lenoard Aden, M.D.DATE OF BIRTH:  1971-08-16  DATE OF PROCEDURE: DATE OF DISCHARGE:  11/28/2013                              OPERATIVE REPORT   PREOPERATIVE DIAGNOSIS:  Pelvic pain, dysmenorrhea, symptomatic fibroids, history of endometriosis.  POSTOPERATIVE DIAGNOSIS:  Pelvic pain, dysmenorrhea, symptomatic fibroids, history of endometriosis plus dense pelvic adhesions, right ovarian endometrioma, parasitic myoma.  PROCEDURE:  Engineer, building services assisted total laparoscopic extensive lysis of adhesions, myomectomy, excision and ablation of endometriosis, right ovarian cystectomy, chromopertubation.  SURGEON:  Lenoard Aden, MD  ASSISTANT:  Fredric Mare.  ESTIMATED BLOOD LOSS:  About 50 mL.  COMPLICATIONS:  None.  DRAINS:  Foley.  COUNTS:  Correct.  The patient to recovery in good condition.  BRIEF OPERATIVE NOTE:  After being apprised of risks of anesthesia, infection, bleeding, injury to surrounding organs, possible need for repair, delayed versus immediate complications to include bowel and bladder injury, possible need for repair, the patient was brought to the operating room where she was administered a general anesthetic without complications, prepped and draped in usual sterile fashion.  Foley catheter placed.  RUMI retractor placed per vagina after a time-out was done.  At this time, infraumbilical incision made with a scalpel in the area of the previous abdominal wall incision.  Veress needle placed opening pressure -2, 3 L of CO2 insufflated without difficulty.  Trocar placement atraumatically.  Visualization reveals atraumatic trocar entry.  Normal liver, gallbladder bed, some adhesions along the right abdominal sidewall and dense adhesions in the cul-de-sac with obliteration of the posterior cul-de-sac  and normal anterior cul-de-sac. An adherent right ovary and right tube to the posterior uterine wall, surface endometriosis, dense adhesions of the left tube to the bowel and myoma that is pedunculated in the midline.  At this time, 2 additional robotic ports were placed right and left and a 5-mm port in left and robot was docked in standard fashion, and sharp dissection was used to dissect the sigmoid colon off the posterior wall of the uterus from this parasitic myoma that comes off at the mid portion of the fundus.  The bowel was extensively adherent and EA Sizer was placed to delineate the impact of the sigmoid.  There is an additional posterior fibroid adherent to the bowel as well.  The bowel was then completely lysed off with both fibroids in the posterior uterine wall, clearing the cul-de- sac.  The fibroid was also adherent into the pelvis sac.  It is dissected sharply and noted to be otherwise not impacting any other areas, the left ureter is peristalsing normally.  There was an abnormal appearing left tube.  In addition,there is absence of the left ovary. The right ovary is then sharply dissected out of the cul-de-sac and endometrioma was opened and the cyst wall was removed.  The pedunculated fibroid that was parasitic in nature was also excised and both were placed in anterior cul-de-sac.  The right tube was also densely adherent, this was sharply dissected and freed at this time.  After stripping the ovarian cyst  wall, good hemostasis was noted.  Irrigation was accomplished.  Excision of endometriosis from the posterior uterine wall and the right ovarian surface were performed.  Ablation of endometriosis along the ovarian fossa on the right was performed as well.  At this time, having achieved good hemostasis, specimen was placed in the EndoCatch bag and removed through the umbilicus.  The Interceed was placed in the posterior uterine wall and wrapping, enveloping the right  ovary as well.  After achieving good hemostasis, CO2 was released.  All instruments were removed.  Incisions were closed using 0 Vicryl, 4-0 Vicryl, and Dermabond.  The patient tolerated the procedure well, RUMI retractor removed, the patient was transferred to recovery in good condition.     Lenoard Aden, M.D.     RJT/MEDQ  D:  11/28/2013  T:  11/29/2013  Job:  [id number]

## 2013-11-30 HISTORY — PX: UTERINE FIBROID SURGERY: SHX826

## 2013-11-30 HISTORY — PX: OVARIAN CYST REMOVAL: SHX89

## 2014-01-17 IMAGING — US US TRANSVAGINAL NON-OB
1 series · 13 of 25 positions shown · non-contrast
Comparison: None

CLINICAL DATA: Pelvic pain



[Series 1: us transvaginal non-ob · 0.28mm/px · 13 of 88 slices shown]
[im 1/88]
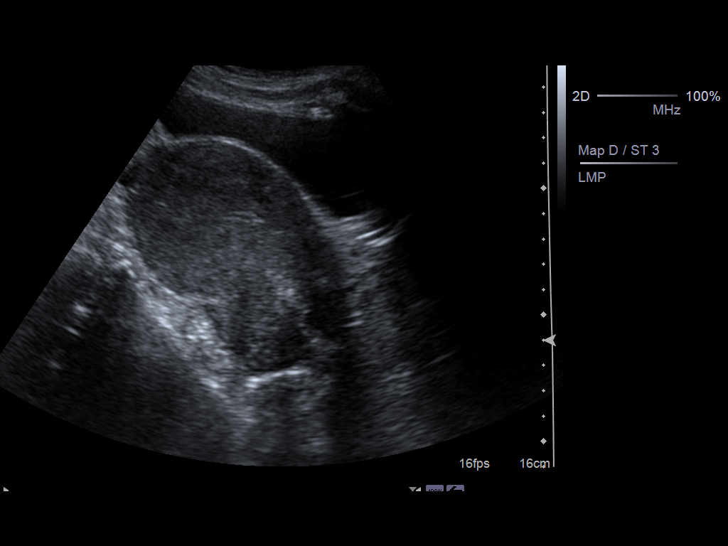
[im 8/88]
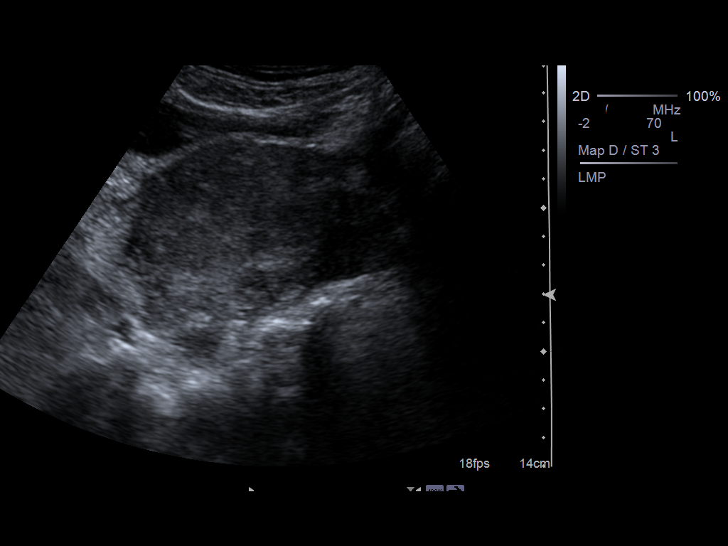
[im 15/88]
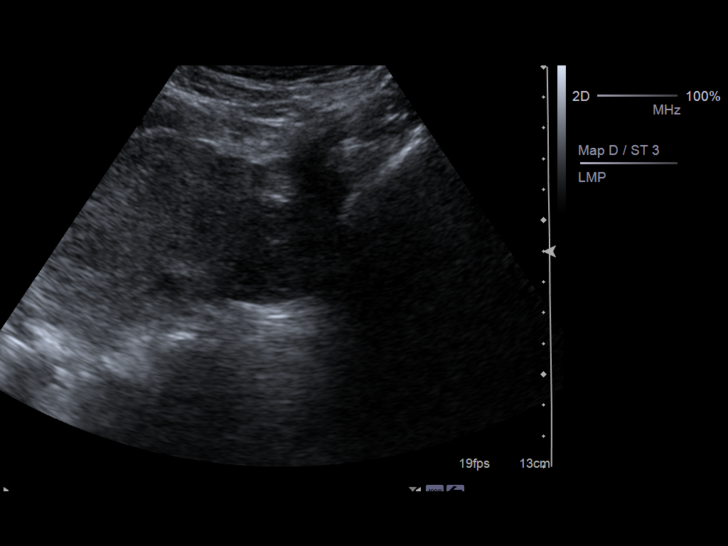
[im 22/88]
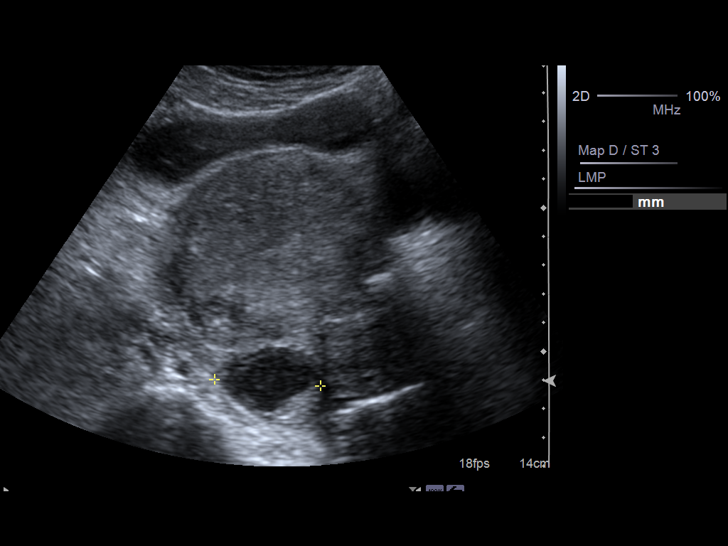
[im 30/88]
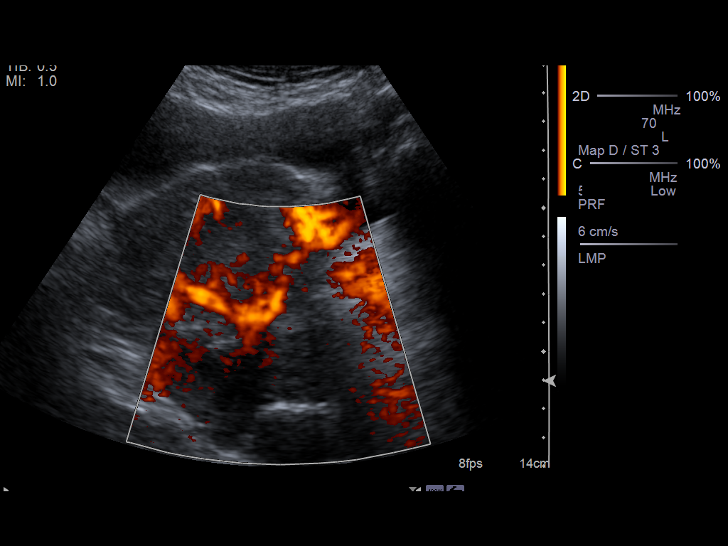
[im 37/88]
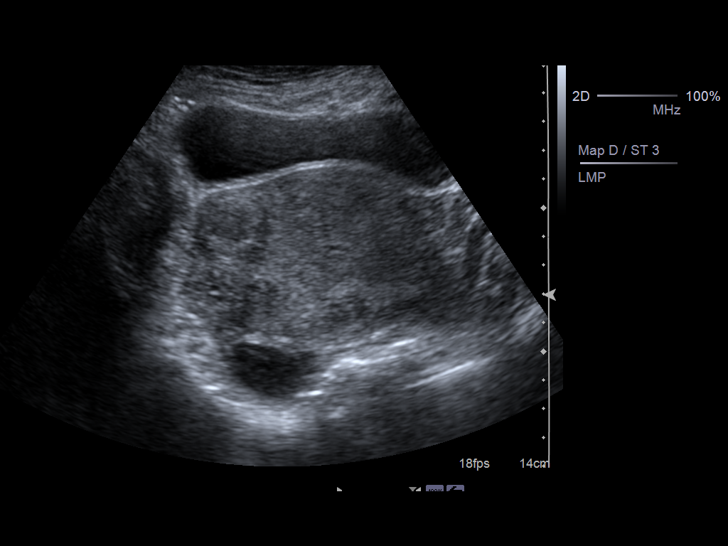
[im 44/88]
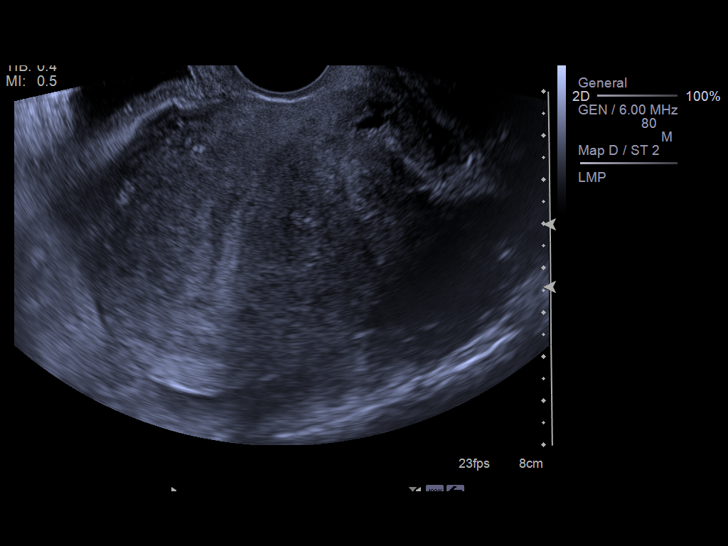
[im 51/88]
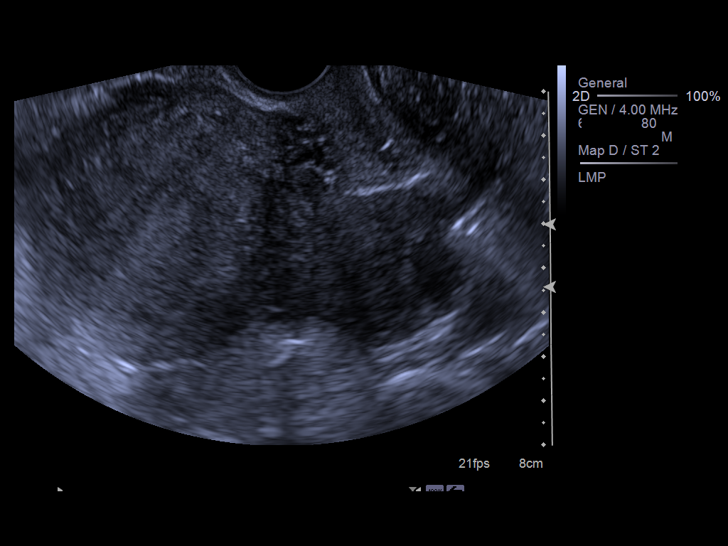
[im 59/88]
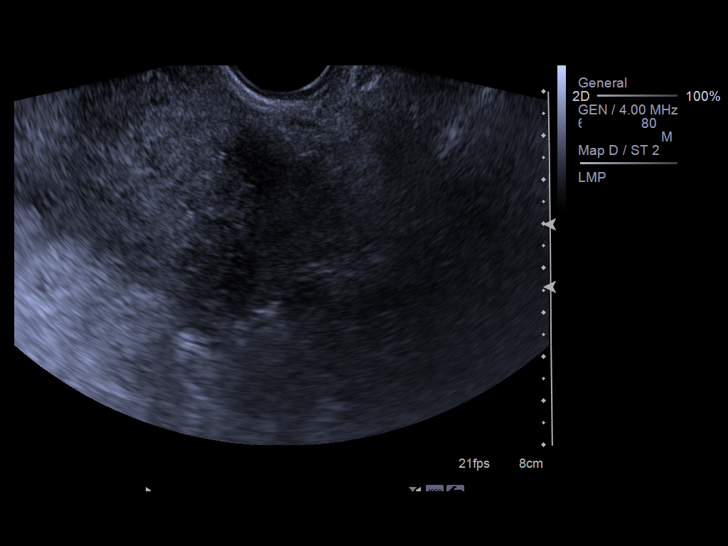
[im 66/88]
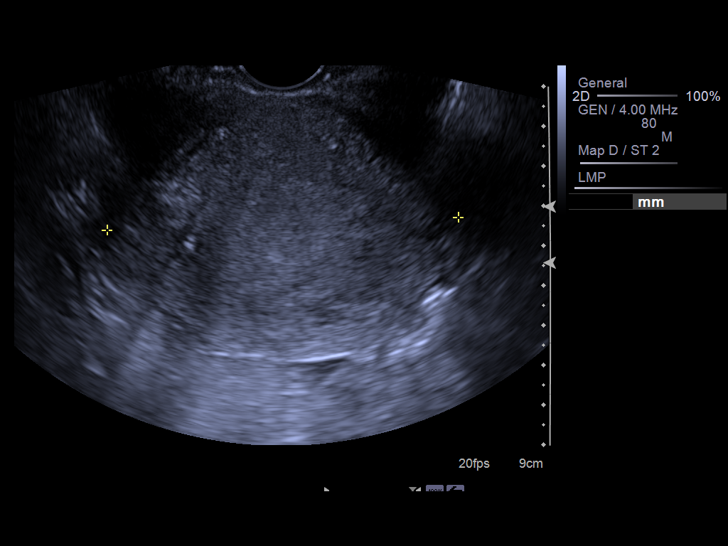
[im 73/88]
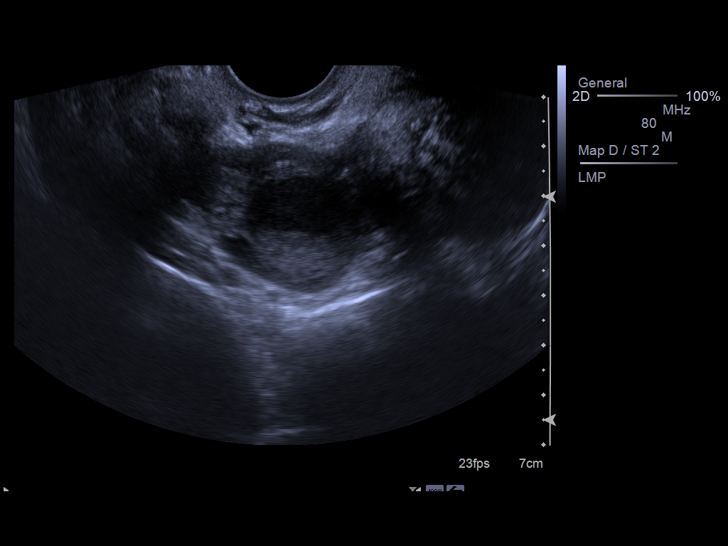
[im 80/88]
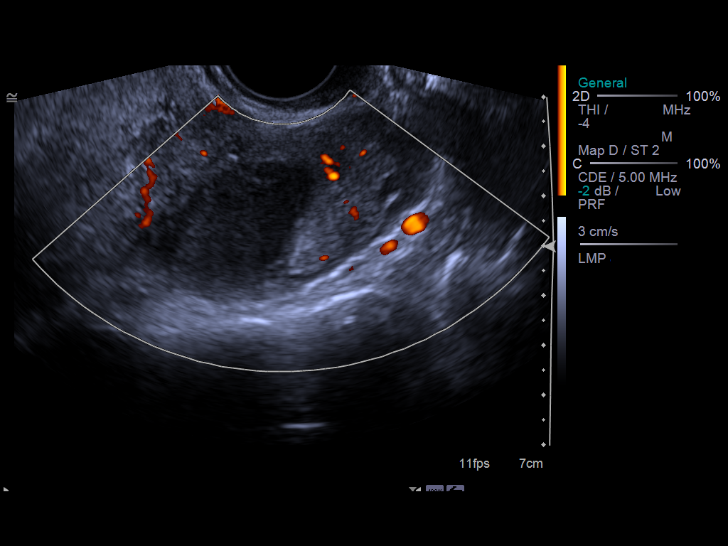
[im 88/88]
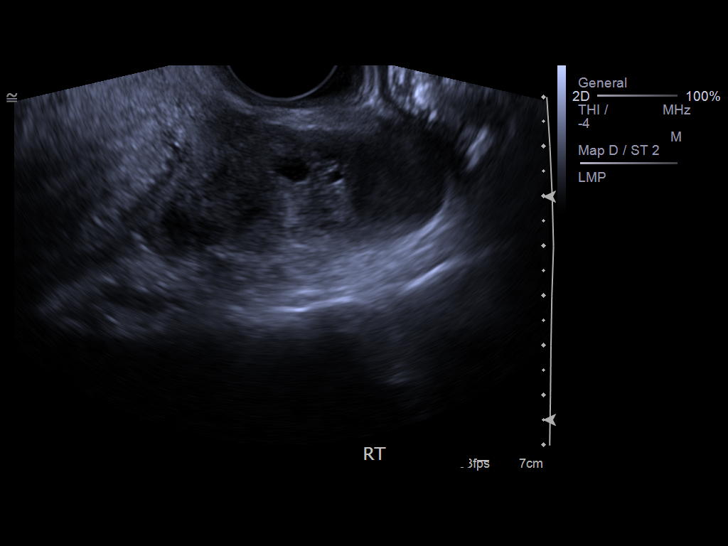

[13 of 25 positions shown; findings below may reference images not displayed]

FINDINGS: Uterus:  Mildly enlarged at 10.0 x 6.6 x 8.8 cm.  There are
multiple fibroids, the largest of which is exophytic from the
posterior margin measuring 4.3 x 3.5 x 4.1 cm.  There are several
intramural fibroids.  No submucosal fibroid visualized.

Endometrium: Debris/fluid within the endometrium.  Thickness
measures up to 8 mm.

Right ovary:  Measures 5.3 x 3.1 x 4.4 cm.  The dominant cyst
measuring 2.5 x 3.8 cm is mildly complex with internal hypoechoic
fluid and echoes and hematocrit level on image 76/88.

Left ovary: Measures 4.7 x 3.3 x 3.5 cm by transabdominal
technique.  Not visualized by transvaginal technique.

Other findings: Small amount of free fluid, predominately within
the right adnexa.
IMPRESSION: Fibroid uterus.

Probable hemorrhagic cyst right ovary. Small amount of free fluid
predominately collects within the right adnexa, therefore may
reflect cyst rupture.

## 2016-04-03 DIAGNOSIS — D509 Iron deficiency anemia, unspecified: Secondary | ICD-10-CM | POA: Insufficient documentation

## 2016-09-06 ENCOUNTER — Emergency Department (HOSPITAL_COMMUNITY)
Admission: EM | Admit: 2016-09-06 | Discharge: 2016-09-06 | Disposition: A | Discharge status: Home / Self Care | Payer: 59 | Attending: Emergency Medicine | Admitting: Emergency Medicine

## 2016-09-06 ENCOUNTER — Encounter (HOSPITAL_COMMUNITY): Payer: Self-pay

## 2016-09-06 ENCOUNTER — Ambulatory Visit (HOSPITAL_COMMUNITY)
Admission: EM | Admit: 2016-09-06 | Discharge: 2016-09-06 | Disposition: A | Discharge status: Nursing Home (Includes ICF) | Payer: 59 | Attending: Internal Medicine | Admitting: Internal Medicine

## 2016-09-06 DIAGNOSIS — R112 Nausea with vomiting, unspecified: Secondary | ICD-10-CM | POA: Diagnosis not present

## 2016-09-06 DIAGNOSIS — E860 Dehydration: Secondary | ICD-10-CM | POA: Diagnosis not present

## 2016-09-06 DIAGNOSIS — R102 Pelvic and perineal pain: Secondary | ICD-10-CM | POA: Insufficient documentation

## 2016-09-06 DIAGNOSIS — R1114 Bilious vomiting: Secondary | ICD-10-CM

## 2016-09-06 DIAGNOSIS — R1030 Lower abdominal pain, unspecified: Secondary | ICD-10-CM | POA: Diagnosis not present

## 2016-09-06 LAB — URINE MICROSCOPIC-ADD ON

## 2016-09-06 LAB — COMPREHENSIVE METABOLIC PANEL
ALT: 21 U/L (ref 14–54)
AST: 25 U/L (ref 15–41)
Albumin: 4.2 g/dL (ref 3.5–5.0)
Alkaline Phosphatase: 66 U/L (ref 38–126)
Anion gap: 11 (ref 5–15)
BUN: 8 mg/dL (ref 6–20)
CO2: 22 mmol/L (ref 22–32)
Calcium: 9.4 mg/dL (ref 8.9–10.3)
Chloride: 107 mmol/L (ref 101–111)
Creatinine, Ser: 0.59 mg/dL (ref 0.44–1.00)
GFR calc Af Amer: >60 mL/min (ref >60)
GFR calc non Af Amer: >60 mL/min (ref >60)
Glucose, Bld: 88 mg/dL (ref 65–99)
Potassium: 3.2 mmol/L — ABNORMAL LOW (ref 3.5–5.1)
Sodium: 140 mmol/L (ref 135–145)
Total Bilirubin: 0.3 mg/dL (ref 0.3–1.2)
Total Protein: 7.5 g/dL (ref 6.5–8.1)

## 2016-09-06 LAB — POCT URINALYSIS DIP (DEVICE)
Glucose, UA: NEGATIVE mg/dL
Ketones, ur: >=160 mg/dL — AB
Leukocytes, UA: NEGATIVE
Nitrite: NEGATIVE
Protein, ur: 100 mg/dL — AB
Specific Gravity, Urine: 1.025 (ref 1.005–1.030)
Urobilinogen, UA: 1 mg/dL (ref 0.0–1.0)
pH: 5.5 (ref 5.0–8.0)

## 2016-09-06 LAB — URINALYSIS, ROUTINE W REFLEX MICROSCOPIC
Glucose, UA: NEGATIVE mg/dL
Ketones, ur: >80 mg/dL — AB
Leukocytes, UA: NEGATIVE
Nitrite: NEGATIVE
Protein, ur: 30 mg/dL — AB
Specific Gravity, Urine: >1.03 — ABNORMAL HIGH (ref 1.005–1.030)
pH: 6 (ref 5.0–8.0)

## 2016-09-06 LAB — CBC
HCT: 31.1 % — ABNORMAL LOW (ref 36.0–46.0)
Hemoglobin: 8.6 g/dL — ABNORMAL LOW (ref 12.0–15.0)
MCH: 17.3 pg — ABNORMAL LOW (ref 26.0–34.0)
MCHC: 27.7 g/dL — ABNORMAL LOW (ref 30.0–36.0)
MCV: 62.7 fL — ABNORMAL LOW (ref 78.0–100.0)
Platelets: 341 10*3/uL (ref 150–400)
RBC: 4.96 MIL/uL (ref 3.87–5.11)
RDW: 21.1 % — ABNORMAL HIGH (ref 11.5–15.5)
WBC: 8.9 10*3/uL (ref 4.0–10.5)

## 2016-09-06 LAB — POCT PREGNANCY, URINE: Preg Test, Ur: NEGATIVE

## 2016-09-06 LAB — LIPASE, BLOOD: Lipase: 27 U/L (ref 11–51)

## 2016-09-06 LAB — I-STAT BETA HCG BLOOD, ED (MC, WL, AP ONLY): I-stat hCG, quantitative: <5 m[IU]/mL (ref <5)

## 2016-09-06 MED ORDER — POTASSIUM CHLORIDE CRYS ER 10 MEQ PO TBCR
30.0000 meq | EXTENDED_RELEASE_TABLET | Freq: Once | ORAL | Status: AC
  Administered 2016-09-06: 30 meq via ORAL
  Filled 2016-09-06: qty 1

## 2016-09-06 MED ORDER — KETOROLAC TROMETHAMINE 15 MG/ML IJ SOLN
15.0000 mg | Freq: Once | INTRAMUSCULAR | Status: AC
  Administered 2016-09-06: 15 mg via INTRAVENOUS
  Filled 2016-09-06: qty 1

## 2016-09-06 MED ORDER — ONDANSETRON 4 MG PO TBDP: ORAL_TABLET | ORAL | 0 refills | Status: DC

## 2016-09-06 MED ORDER — ONDANSETRON HCL 4 MG/2ML IJ SOLN
8.0000 mg | Freq: Once | INTRAMUSCULAR | Status: AC
  Administered 2016-09-06: 8 mg via INTRAMUSCULAR

## 2016-09-06 MED ORDER — IBUPROFEN 800 MG PO TABS: 800.0000 mg | ORAL_TABLET | Freq: Four times a day (QID) | ORAL | 1 refills | Status: DC | PRN

## 2016-09-06 MED ORDER — SODIUM CHLORIDE 0.9 % IV BOLUS (SEPSIS)
1000.0000 mL | Freq: Once | INTRAVENOUS | Status: AC
  Administered 2016-09-06: 1000 mL via INTRAVENOUS

## 2016-09-06 MED ORDER — ONDANSETRON HCL 4 MG/2ML IJ SOLN
INTRAMUSCULAR | Status: AC
  Filled 2016-09-06: qty 4

## 2016-09-06 NOTE — ED Provider Notes (Signed)
[facility] DEPT Provider Note   CSN: [id number] Arrival date & time: 09/06/16  1621     History   Chief Complaint Chief Complaint  Patient presents with  . Abdominal Pain  . Emesis    HPI [name] is a 45 y.o. female.  HPI   Patient is a 45 year old female with a history of uterine fibroids, anemia who presents to the emergency department from urgent care with complaint of pelvic cramping, nausea, vomiting that began this morning. Patient states she started her period today. Patient states her menstrual cramping is similar to her normal cramps although more severe. She has tried Tylenol without relief. She states the vomiting occurs when her pain is severe. States her pain as 8/10. Patient has a history of anemia and heavy menstrual bleeding. Patient denies headache, dizziness, emesis, dysuria, hematochezia, syncope, fever.  Past Medical History:  Diagnosis Date  . Abnormal Pap smear   . Anemia   . Fibroid   . Fibroids     There are no active problems to display for this patient.   Past Surgical History:  Procedure Laterality Date  .  diagnostic laparscopic  06/2004  . CHROMOPERTUBATION N/A 11/28/2013   Procedure: CHROMOPERTUBATION, ;  Surgeon: Lovenia Kim, MD;  Location: [facility];  Service: Gynecology;  Laterality: N/A;  . DIAGNOSTIC LAPAROSCOPY  2005   [place]  . OVARIAN CYST REMOVAL  2015  . ROBOTIC ASSISTED LAPAROSCOPIC LYSIS OF ADHESION N/A 11/28/2013   Procedure: ROBOTIC ASSISTED LAPAROSCOPIC LYSIS OF ADHESION; EXCISION OF RIGHT OVARIAN CYST WALL AND MYOMECTOMY;  Surgeon: Lovenia Kim, MD;  Location: [facility];  Service: Gynecology;  Laterality: N/A;  . UTERINE FIBROID SURGERY  2015  . WISDOM TOOTH EXTRACTION      OB History    No data available       Home Medications    Prior to Admission medications   Medication Sig Start Date End Date Taking? Authorizing Provider  acetaminophen (TYLENOL) 500 MG tablet Take 500 mg by mouth every 6  (six) hours as needed for pain.   Yes Historical Provider, MD  IRON PO Take 1 tablet by mouth daily.    Yes Historical Provider, MD  HYDROcodone-acetaminophen (NORCO) 5-325 MG per tablet Take 1 tablet by mouth every 4 (four) hours as needed. Patient not taking: Reported on 09/06/2016 11/05/13   Julianne Rice, MD  ibuprofen (ADVIL,MOTRIN) 800 MG tablet Take 1 tablet (800 mg total) by mouth every 6 (six) hours as needed. 09/06/16   Kalman Drape, PA  ondansetron (ZOFRAN ODT) 4 MG disintegrating tablet 58m ODT q4 hours prn nausea/vomit 09/06/16   JKalman Drape PA  oxyCODONE-acetaminophen (ROXICET) 5-325 MG per tablet Take 1-2 tablets by mouth every 4 (four) hours as needed for severe pain. Patient not taking: Reported on 09/06/2016 11/28/13   RBrien Few MD    Family History No family history on file.  Social History Social History  Substance Use Topics  . Smoking status: Never Smoker  . Smokeless tobacco: Never Used  . Alcohol use No     Allergies   Review of patient's allergies indicates no known allergies.   Review of Systems Review of Systems  Constitutional: Negative for chills and fever.  Respiratory: Negative for shortness of breath.   Cardiovascular: Negative for chest pain and leg swelling.  Gastrointestinal: Positive for nausea and vomiting. Negative for abdominal distention, abdominal pain, blood in stool and diarrhea.  Genitourinary: Positive for pelvic pain and vaginal bleeding. Negative  for dysuria.  Neurological: Negative for dizziness, syncope, weakness, numbness and headaches.  All other systems reviewed and are negative.    Physical Exam Updated Vital Signs BP 133/82   Pulse 84   Temp 98.3 F (36.8 C) (Oral)   Resp 16   Ht 5' 7"  (1.702 m)   Wt 59 kg   LMP 09/06/2016 (Exact Date)   SpO2 100%   BMI 20.36 kg/m   Physical Exam  Constitutional: She appears well-developed and well-nourished. No distress.  HENT:  Head: Normocephalic and atraumatic.   Eyes: Conjunctivae are normal.  Neck: Normal range of motion.  Cardiovascular: Normal rate, regular rhythm, normal heart sounds and intact distal pulses.   No extrasystoles are present. Exam reveals no gallop and no friction rub.   No murmur heard. Pulses:      Radial pulses are 2+ on the right side, and 2+ on the left side.       Dorsalis pedis pulses are 2+ on the right side, and 2+ on the left side.  Pulmonary/Chest: Effort normal. No respiratory distress. She has no wheezes. She has no rales.  Abdominal: Soft. Normal appearance and bowel sounds are normal. She exhibits no distension. There is tenderness in the suprapubic area. There is no rigidity, no guarding and no CVA tenderness.  Musculoskeletal: Normal range of motion. She exhibits no edema.  Neurological: She is alert. Coordination normal.  Skin: Skin is warm and dry. She is not diaphoretic.  Psychiatric: She has a normal mood and affect. Her behavior is normal.  Nursing note and vitals reviewed.    ED Treatments / Results  Labs (all labs ordered are listed, but only abnormal results are displayed) Labs Reviewed  COMPREHENSIVE METABOLIC PANEL - Abnormal; Notable for the following:       Result Value   Potassium 3.2 (*)    All other components within normal limits  CBC - Abnormal; Notable for the following:    Hemoglobin 8.6 (*)    HCT 31.1 (*)    MCV 62.7 (*)    MCH 17.3 (*)    MCHC 27.7 (*)    RDW 21.1 (*)    All other components within normal limits  URINALYSIS, ROUTINE W REFLEX MICROSCOPIC (NOT AT [facility]) - Abnormal; Notable for the following:    APPearance CLOUDY (*)    Specific Gravity, Urine >1.030 (*)    Hgb urine dipstick LARGE (*)    Bilirubin Urine SMALL (*)    Ketones, ur >80 (*)    Protein, ur 30 (*)    All other components within normal limits  URINE MICROSCOPIC-ADD ON - Abnormal; Notable for the following:    Squamous Epithelial / LPF 0-5 (*)    Bacteria, UA FEW (*)    All other components within  normal limits  LIPASE, BLOOD  I-STAT BETA HCG BLOOD, ED ([facility], [facility], [facility] ONLY)    EKG  EKG Interpretation None       Radiology No results found.  Procedures Procedures (including critical care time)  Medications Ordered in ED Medications  sodium chloride 0.9 % bolus 1,000 mL (0 mLs Intravenous Stopped 09/06/16 2119)  ketorolac (TORADOL) 15 MG/ML injection 15 mg (15 mg Intravenous Given 09/06/16 2112)  potassium chloride (K-DUR,KLOR-CON) CR tablet 30 mEq (30 mEq Oral Given 09/06/16 2112)     Initial Impression / Assessment and Plan / ED Course  I have reviewed the triage vital signs and the nursing notes.  Pertinent labs & imaging results that were  available during my care of the patient were reviewed by me and considered in my medical decision making (see chart for details).  Clinical Course   Patient with nausea and vomiting secondary to menstrual cramping. Patient was given a liter fluids in the ED. She was given Zofran at the urgent care. Her nausea and vomiting resolved while in the ED. Patient was given Toradol for her menstrual cramps. She states her menstrual cramps resolved prior to discharge. Labs unremarkable. Mild hypokalemia. Patient was given PO potassium in the ED. Patient with history of anemia with hemoglobin of 8.6. This is close to patient's baseline. She states she takes iron supplements. Patient is afebrile, VSS. Pt denies dizziness, syncope. Instructed patient to follow up with her OB/GYN this week to discuss her menorrhagia and her PCP to discuss her anemia. Discharge patient on ibuprofen and Zofran. Discussed strict return precautions in the ED. Patient was understanding to the discharge instructions.  Pt case discussed with Dr. Billy Fischer who agrees with the above plan.   Final Clinical Impressions(s) / ED Diagnoses   Final diagnoses:  Non-intractable vomiting with nausea, unspecified vomiting type  Pelvic pain    New Prescriptions Discharge Medication  List as of 09/06/2016 10:40 PM    START taking these medications   Details  ibuprofen (ADVIL,MOTRIN) 800 MG tablet Take 1 tablet (800 mg total) by mouth every 6 (six) hours as needed., Starting Sun 09/06/2016, [place], PA 09/06/16 2352    Gareth Morgan, MD 09/13/16 1656

## 2016-09-06 NOTE — ED Provider Notes (Signed)
[facility]    CSN: [id number] Arrival date & time: 09/06/16  1215     History   Chief Complaint Chief Complaint  Patient presents with  . Abdominal Pain  . Emesis    HPI [name] is a 45 y.o. female.   HPI Patient presents with the above complaints.  States that she started her menstrual cycle yesterday and shortly after she began having pelvic/lower abd pain and nausea/vomiting.  States that she has had this problem for years when she starts her period.  OB/GYN Dr Ronita Hipps removed fibroid cysts per patient to help with her symptoms and she states that the next course of treatment would be hysterectomy if not getting better.  States that she will be contacting him to schedule surgery.  She had previously taken zofran but is not currently.  15+ episodes of vomiting which is now bilious.  Has not eaten anything since yesterday afternoon.  Hard to keep down liquids. Denies fever, chills.  Has vaginal bleeding from period.   Past Medical History:  Diagnosis Date  . Abnormal Pap smear   . Anemia   . Fibroid   . Fibroids     There are no active problems to display for this patient.   Past Surgical History:  Procedure Laterality Date  .  diagnostic laparscopic  06/2004  . CHROMOPERTUBATION N/A 11/28/2013   Procedure: CHROMOPERTUBATION, ;  Surgeon: Lovenia Kim, MD;  Location: [facility];  Service: Gynecology;  Laterality: N/A;  . DIAGNOSTIC LAPAROSCOPY  2005   [place]  . OVARIAN CYST REMOVAL  2015  . ROBOTIC ASSISTED LAPAROSCOPIC LYSIS OF ADHESION N/A 11/28/2013   Procedure: ROBOTIC ASSISTED LAPAROSCOPIC LYSIS OF ADHESION; EXCISION OF RIGHT OVARIAN CYST WALL AND MYOMECTOMY;  Surgeon: Lovenia Kim, MD;  Location: [facility];  Service: Gynecology;  Laterality: N/A;  . UTERINE FIBROID SURGERY  2015  . WISDOM TOOTH EXTRACTION      OB History    No data available       Home Medications    Prior to Admission medications   Medication Sig Start Date End  Date Taking? Authorizing Provider  acetaminophen (TYLENOL) 500 MG tablet Take 500 mg by mouth every 6 (six) hours as needed for pain.   Yes Historical Provider, MD  HYDROcodone-acetaminophen (NORCO) 5-325 MG per tablet Take 1 tablet by mouth every 4 (four) hours as needed. 11/05/13   Julianne Rice, MD  ibuprofen (ADVIL,MOTRIN) 200 MG tablet Take 200 mg by mouth every 6 (six) hours as needed for pain.    Historical Provider, MD  ondansetron (ZOFRAN ODT) 4 MG disintegrating tablet 85m ODT q4 hours prn nausea/vomit 11/05/13   DJulianne Rice MD  oxyCODONE-acetaminophen (ROXICET) 5-325 MG per tablet Take 1-2 tablets by mouth every 4 (four) hours as needed for severe pain. 11/28/13   RBrien Few MD    Family History History reviewed. No pertinent family history.  Social History Social History  Substance Use Topics  . Smoking status: Never Smoker  . Smokeless tobacco: Never Used  . Alcohol use No     Allergies   Review of patient's allergies indicates no known allergies.   Review of Systems Review of Systems  Constitutional: Positive for activity change, appetite change and fatigue. Negative for chills and fever.  HENT: Negative.   Respiratory: Negative.   Cardiovascular: Negative.   Gastrointestinal: Positive for abdominal pain and vomiting. Negative for blood in stool.  Genitourinary: Positive for pelvic pain and vaginal bleeding. Negative for  vaginal discharge and vaginal pain.  Musculoskeletal: Negative.   Neurological: Negative.   Psychiatric/Behavioral: Negative.      Physical Exam Triage Vital Signs ED Triage Vitals  Enc Vitals Group     BP 09/06/16 1327 119/77     Pulse Rate 09/06/16 1327 78     Resp 09/06/16 1327 16     Temp 09/06/16 1327 98.1 F (36.7 C)     Temp Source 09/06/16 1327 Oral     SpO2 09/06/16 1327 100 %     Weight --      Height --      Head Circumference --      Peak Flow --      Pain Score 09/06/16 1419 8     Pain Loc --      Pain Edu?  --      Excl. in [place]? --    No data found.   Updated Vital Signs BP 119/77 (BP Location: Right Arm)   Pulse 78   Temp 98.1 F (36.7 C) (Oral)   Resp 16   LMP 09/06/2016 (Exact Date)   SpO2 100%   Visual Acuity Right Eye Distance:   Left Eye Distance:   Bilateral Distance:    Right Eye Near:   Left Eye Near:    Bilateral Near:     Physical Exam   UC Treatments / Results  Labs (all labs ordered are listed, but only abnormal results are displayed) Labs Reviewed  HEPATIC FUNCTION PANEL    EKG  EKG Interpretation None       Radiology No results found.  Procedures Procedures (including critical care time)  Medications Ordered in UC Medications  ondansetron (ZOFRAN) injection 8 mg (8 mg Intramuscular Given 09/06/16 1538)     Initial Impression / Assessment and Plan / UC Course  I have reviewed the triage vital signs and the nursing notes.  Pertinent labs & imaging results that were available during my care of the patient were reviewed by me and considered in my medical decision making (see chart for details).  Clinical Course      Final Clinical Impressions(s) / UC Diagnoses   Final diagnoses:  Lower abdominal pain  Bilious vomiting with nausea  Dehydration    New Prescriptions New Prescriptions   No medications on file  patient was given zofran 67m IM and ginger ale to drink.  Not any better.  She has poor venous access.  Will transfer down to ED for evaluation and treatment.  May need IV hydration.  Plan discussed with patient and she voices understanding.     JLanae Crumbly PA-C 09/06/16 [phone]

## 2016-09-06 NOTE — ED Triage Notes (Addendum)
Patient sent from urgent care for further evaluation of upper abdominal cramping and vomiting since early am. No diarrhea. Also started her menstrual cycle yesterday. Actively vomiting on arrival

## 2016-09-06 NOTE — ED Triage Notes (Signed)
Pt complains of abdominal pain along with nausea and vomiting. Has been taking tylenol every 4 hrs but not helping. Has hx of cyst and fibroids and currently has a OBGYN; [facility].

## 2016-09-06 NOTE — ED Notes (Signed)
Per provider verbal order, pt given fluid challenge PO

## 2016-09-06 NOTE — Discharge Instructions (Signed)
Follow-up with your primary care provider this week regarding your anemia. Follow-up with your OB/GYN regarding your heavy menstrual bleeding and pelvic cramping. Take the ibuprofen as prescribed to be sure to eat with this medication. Take the Zofran as prescribed as needed. Return immediately to the emergency department if you experience fatigue, dizziness, you pass out, vomiting, fever, worsening cramps, or any other concerning symptoms.

## 2016-09-06 NOTE — ED Notes (Signed)
Pt reports hx of fibroid removal. She reports the "cramping" pain she's having at this time is consistent with fibroid pain in the past. She states she always vomits as she is today when cramping bad on cycle.

## 2016-09-07 MED FILL — ONDANSETRON ODT 4 MG TABLET: 4 | 5 days supply | Qty: 30 | Fill #0

## 2016-09-07 MED FILL — IBUPROFEN 800 MG TABLET: 800 | 8 days supply | Qty: 30 | Fill #0

## 2016-12-14 MED FILL — FERROUS SULFATE 325 MG TAB: 325 (65 FE) | 30 days supply | Qty: 30 | Fill #0

## 2016-12-17 MED FILL — IBUPROFEN 800 MG TABLET: 800 | 8 days supply | Qty: 30 | Fill #1

## 2017-01-13 ENCOUNTER — Other Ambulatory Visit: Payer: Self-pay | Admitting: Certified Nurse Midwife

## 2017-01-13 MED ORDER — OSELTAMIVIR PHOSPHATE 75 MG PO CAPS: 75.0000 mg | ORAL_CAPSULE | Freq: Two times a day (BID) | ORAL | 0 refills | Status: DC

## 2017-03-06 ENCOUNTER — Encounter (HOSPITAL_COMMUNITY): Payer: Self-pay | Admitting: *Deleted

## 2017-03-06 ENCOUNTER — Inpatient Hospital Stay (HOSPITAL_COMMUNITY)
Admission: AD | Admit: 2017-03-06 | Discharge: 2017-03-07 | Disposition: A | Discharge status: Home / Self Care | Payer: 59 | Source: Ambulatory Visit | Attending: Obstetrics and Gynecology | Admitting: Obstetrics and Gynecology

## 2017-03-06 DIAGNOSIS — Z9889 Other specified postprocedural states: Secondary | ICD-10-CM | POA: Diagnosis not present

## 2017-03-06 DIAGNOSIS — N809 Endometriosis, unspecified: Secondary | ICD-10-CM | POA: Insufficient documentation

## 2017-03-06 DIAGNOSIS — Z8742 Personal history of other diseases of the female genital tract: Secondary | ICD-10-CM

## 2017-03-06 DIAGNOSIS — N946 Dysmenorrhea, unspecified: Secondary | ICD-10-CM | POA: Diagnosis not present

## 2017-03-06 DIAGNOSIS — R1030 Lower abdominal pain, unspecified: Secondary | ICD-10-CM | POA: Insufficient documentation

## 2017-03-06 DIAGNOSIS — Z79899 Other long term (current) drug therapy: Secondary | ICD-10-CM | POA: Diagnosis not present

## 2017-03-06 DIAGNOSIS — R109 Unspecified abdominal pain: Secondary | ICD-10-CM | POA: Diagnosis present

## 2017-03-06 DIAGNOSIS — R112 Nausea with vomiting, unspecified: Secondary | ICD-10-CM | POA: Diagnosis not present

## 2017-03-06 HISTORY — DX: Endometriosis, unspecified: N80.9

## 2017-03-06 HISTORY — DX: Unspecified ovarian cyst, unspecified side: N83.209

## 2017-03-06 LAB — URINALYSIS, ROUTINE W REFLEX MICROSCOPIC
Bacteria, UA: NONE SEEN
Bilirubin Urine: NEGATIVE
Glucose, UA: NEGATIVE mg/dL
Ketones, ur: 80 mg/dL — AB
Leukocytes, UA: NEGATIVE
Nitrite: NEGATIVE
Protein, ur: 30 mg/dL — AB
Specific Gravity, Urine: 1.028 (ref 1.005–1.030)
pH: 5 (ref 5.0–8.0)

## 2017-03-06 LAB — CBC WITH DIFFERENTIAL/PLATELET
Basophils Absolute: 0 10*3/uL (ref 0.0–0.1)
Basophils Relative: 0 %
Eosinophils Absolute: 0 10*3/uL (ref 0.0–0.7)
Eosinophils Relative: 0 %
HCT: 31.8 % — ABNORMAL LOW (ref 36.0–46.0)
Hemoglobin: 9.8 g/dL — ABNORMAL LOW (ref 12.0–15.0)
Lymphocytes Relative: 15 %
Lymphs Abs: 1.2 10*3/uL (ref 0.7–4.0)
MCH: 22 pg — ABNORMAL LOW (ref 26.0–34.0)
MCHC: 30.8 g/dL (ref 30.0–36.0)
MCV: 71.5 fL — ABNORMAL LOW (ref 78.0–100.0)
Monocytes Absolute: 0.2 10*3/uL (ref 0.1–1.0)
Monocytes Relative: 2 %
Neutro Abs: 6.8 10*3/uL (ref 1.7–7.7)
Neutrophils Relative %: 83 %
Other: 0 %
Platelets: 297 10*3/uL (ref 150–400)
RBC: 4.45 MIL/uL (ref 3.87–5.11)
RDW: 21.7 % — ABNORMAL HIGH (ref 11.5–15.5)
WBC: 8.2 10*3/uL (ref 4.0–10.5)

## 2017-03-06 LAB — POCT PREGNANCY, URINE: Preg Test, Ur: NEGATIVE

## 2017-03-06 MED ORDER — KETOROLAC TROMETHAMINE 30 MG/ML IJ SOLN
30.0000 mg | Freq: Once | INTRAMUSCULAR | Status: AC
  Administered 2017-03-06: 30 mg via INTRAVENOUS
  Filled 2017-03-06: qty 1

## 2017-03-06 MED ORDER — DEXTROSE 5 % IN LACTATED RINGERS IV BOLUS
1000.0000 mL | Freq: Once | INTRAVENOUS | Status: AC
  Administered 2017-03-06: 1000 mL via INTRAVENOUS

## 2017-03-06 MED ORDER — PROMETHAZINE HCL 25 MG/ML IJ SOLN
25.0000 mg | Freq: Once | INTRAMUSCULAR | Status: AC
  Administered 2017-03-06: 25 mg via INTRAVENOUS
  Filled 2017-03-06: qty 1

## 2017-03-06 NOTE — MAU Note (Addendum)
Started cycle Friday. Usually have bad cramps and vomiting with cycle. Vomiting all day since 0800. Taken Zofran ODT and not helping. Have pain RLQ of abdomen. Usually cramping is all over abd. Tylenol not helping. Hx fibroids and ovarian cysts but had cysts and fibroids moved.

## 2017-03-06 NOTE — MAU Provider Note (Signed)
History     CSN: [id number]  Arrival date and time: 03/06/17 2154   First Provider Initiated Contact with Patient 03/06/17 2236      Chief Complaint  Patient presents with  . Abdominal Pain  . Emesis   HPI [name] is a 46 y.o. G0P0000 female who presents with abdominal pain & n/v. PMH significant for endometriosis. Symptoms began yesterday with onset of her menses. States she has vomited countless times today since 8 am and has been unable to keep down any food. Lower abdominal pain she describes as cramp like & constant. Feels pain throughout lower abdomen but worse in RLQ. States she normally has cramping & vomiting with menses but hasn't had an episode this bad since last year. Denies fever/chills, diarrhea, constipation, dysuria, anorexia.   Pertinent Gynecological History: Menses: flow is moderate, regular every month without intermenstrual spotting, with severe dysmenorrhea and usually lasts for 7 days Contraception: none Blood transfusions: none Sexually transmitted diseases: no past history Previous GYN Procedures: ablation, chromopertubation, ovarian cyst removal, lysis of adhesions, laparoscopic dx of endometriosis   Past Medical History:  Diagnosis Date  . Abnormal Pap smear   . Anemia   . Endometriosis   . Fibroids   . Ovarian cyst     Past Surgical History:  Procedure Laterality Date  .  diagnostic laparscopic  06/2004  . CHROMOPERTUBATION N/A 11/28/2013   Procedure: CHROMOPERTUBATION, ;  Surgeon: Lovenia Kim, MD;  Location: [facility];  Service: Gynecology;  Laterality: N/A;  . DIAGNOSTIC LAPAROSCOPY  2005   [place]  . OVARIAN CYST REMOVAL  2015  . ROBOTIC ASSISTED LAPAROSCOPIC LYSIS OF ADHESION N/A 11/28/2013   Procedure: ROBOTIC ASSISTED LAPAROSCOPIC LYSIS OF ADHESION; EXCISION OF RIGHT OVARIAN CYST WALL AND MYOMECTOMY;  Surgeon: Lovenia Kim, MD;  Location: [facility];  Service: Gynecology;  Laterality: N/A;  . UTERINE FIBROID SURGERY  2015   . WISDOM TOOTH EXTRACTION      History reviewed. No pertinent family history.  Social History  Substance Use Topics  . Smoking status: Never Smoker  . Smokeless tobacco: Never Used  . Alcohol use No    Allergies: No Known Allergies  Prescriptions Prior to Admission  Medication Sig Dispense Refill Last Dose  . acetaminophen (TYLENOL) 500 MG tablet Take 1,000 mg by mouth every 6 (six) hours as needed for pain.    03/06/2017 at 2000  . IRON PO Take 1 tablet by mouth daily.    Past Month at Unknown time  . HYDROcodone-acetaminophen (NORCO) 5-325 MG per tablet Take 1 tablet by mouth every 4 (four) hours as needed. (Patient not taking: Reported on 09/06/2016) 10 tablet 0 Not Taking at Unknown time  . ibuprofen (ADVIL,MOTRIN) 800 MG tablet Take 1 tablet (800 mg total) by mouth every 6 (six) hours as needed. 30 tablet 1   . ondansetron (ZOFRAN ODT) 4 MG disintegrating tablet 40m ODT q4 hours prn nausea/vomit 30 tablet 0   . oseltamivir (TAMIFLU) 75 MG capsule Take 1 capsule (75 mg total) by mouth 2 (two) times daily. 14 capsule 0   . oxyCODONE-acetaminophen (ROXICET) 5-325 MG per tablet Take 1-2 tablets by mouth every 4 (four) hours as needed for severe pain. (Patient not taking: Reported on 09/06/2016) 40 tablet 0 Not Taking at Unknown time    Review of Systems  Constitutional: Negative for appetite change, chills and fever.  Gastrointestinal: Positive for abdominal pain, nausea and vomiting. Negative for abdominal distention, constipation and diarrhea.  Genitourinary: Positive  for menstrual problem and vaginal bleeding. Negative for dyspareunia, dysuria and vaginal discharge.   Physical Exam   Blood pressure 112/69, pulse 79, temperature 98.3 F (36.8 C), resp. rate 18, height 5' 7"  (1.702 m), weight 137 lb (62.1 kg).  Physical Exam  Nursing note and vitals reviewed. Constitutional: She is oriented to person, place, and time. She appears well-developed and well-nourished. No distress.   HENT:  Head: Normocephalic and atraumatic.  Eyes: Conjunctivae are normal. Right eye exhibits no discharge. Left eye exhibits no discharge. No scleral icterus.  Neck: Normal range of motion.  Cardiovascular: Normal rate, regular rhythm and normal heart sounds.   No murmur heard. Respiratory: Effort normal and breath sounds normal. No respiratory distress. She has no wheezes.  GI: Soft. Bowel sounds are normal. There is tenderness (with deep palpation) in the right lower quadrant. There is no rigidity, no rebound and no guarding.  Genitourinary: Cervix exhibits no motion tenderness. Right adnexum displays no mass and no tenderness. Left adnexum displays no mass and no tenderness.  Neurological: She is alert and oriented to person, place, and time.  Skin: Skin is warm and dry. She is not diaphoretic.  Psychiatric: She has a normal mood and affect. Her behavior is normal. Judgment and thought content normal.    MAU Course  Procedures Results for orders placed or performed during the hospital encounter of 03/06/17 (from the past 24 hour(s))  Urinalysis, Routine w reflex microscopic     Status: Abnormal   Collection Time: 03/06/17 10:10 PM  Result Value Ref Range   Color, Urine YELLOW YELLOW   APPearance HAZY (A) CLEAR   Specific Gravity, Urine 1.028 1.005 - 1.030   pH 5.0 5.0 - 8.0   Glucose, UA NEGATIVE NEGATIVE mg/dL   Hgb urine dipstick LARGE (A) NEGATIVE   Bilirubin Urine NEGATIVE NEGATIVE   Ketones, ur 80 (A) NEGATIVE mg/dL   Protein, ur 30 (A) NEGATIVE mg/dL   Nitrite NEGATIVE NEGATIVE   Leukocytes, UA NEGATIVE NEGATIVE   RBC / HPF TOO NUMEROUS TO COUNT 0 - 5 RBC/hpf   WBC, UA 6-30 0 - 5 WBC/hpf   Bacteria, UA NONE SEEN NONE SEEN   Squamous Epithelial / LPF 0-5 (A) NONE SEEN   Mucous PRESENT   Pregnancy, urine POC     Status: None   Collection Time: 03/06/17 10:41 PM  Result Value Ref Range   Preg Test, Ur NEGATIVE NEGATIVE  CBC with Differential/Platelet     Status:  Abnormal   Collection Time: 03/06/17 10:58 PM  Result Value Ref Range   WBC 8.2 4.0 - 10.5 K/uL   RBC 4.45 3.87 - 5.11 MIL/uL   Hemoglobin 9.8 (L) 12.0 - 15.0 g/dL   HCT 31.8 (L) 36.0 - 46.0 %   MCV 71.5 (L) 78.0 - 100.0 fL   MCH 22.0 (L) 26.0 - 34.0 pg   MCHC 30.8 30.0 - 36.0 g/dL   RDW 21.7 (H) 11.5 - 15.5 %   Platelets 297 150 - 400 K/uL   Neutrophils Relative % 83 %   Lymphocytes Relative 15 %   Monocytes Relative 2 %   Eosinophils Relative 0 %   Basophils Relative 0 %   Other 0 %   Neutro Abs 6.8 1.7 - 7.7 K/uL   Lymphs Abs 1.2 0.7 - 4.0 K/uL   Monocytes Absolute 0.2 0.1 - 1.0 K/uL   Eosinophils Absolute 0.0 0.0 - 0.7 K/uL   Basophils Absolute 0.0 0.0 - 0.1 K/uL  RBC Morphology OVALOCYTES     MDM UPT negative VSS IV fluids (D5LR), phenergan 25 mg, toradol 30 mg IV Patient reports improvement in symptoms CBC  Assessment and Plan  A; 1. Dysmenorrhea   2. Hx of endometriosis    P: Discharge home Rx phenergan & naproxen Discussed reasons to return to MAU Schedule f/u with gyn of choice to manage endometriosis  Jorje Guild 03/06/2017, 10:36 PM

## 2017-03-07 DIAGNOSIS — N946 Dysmenorrhea, unspecified: Secondary | ICD-10-CM

## 2017-03-07 MED ORDER — NAPROXEN 500 MG PO TABS: 500.0000 mg | ORAL_TABLET | Freq: Two times a day (BID) | ORAL | 0 refills | Status: DC

## 2017-03-07 MED ORDER — PROMETHAZINE HCL 25 MG PO TABS: 25.0000 mg | ORAL_TABLET | Freq: Four times a day (QID) | ORAL | 0 refills | Status: DC | PRN

## 2017-03-07 NOTE — Progress Notes (Signed)
Jorje Guild NP in earlier to discuss test results and d/c plan. Written and verbal d/c instructions given and understanding voiced

## 2017-03-07 NOTE — Discharge Instructions (Signed)
Dysmenorrhea Menstrual cramps (dysmenorrhea) are caused by the muscles of the uterus tightening (contracting) during a menstrual period. For some women, this discomfort is merely bothersome. For others, dysmenorrhea can be severe enough to interfere with everyday activities for a few days each month. Primary dysmenorrhea is menstrual cramps that last a couple of days when you start having menstrual periods or soon after. This often begins after a teenager starts having her period. As a woman gets older or has a baby, the cramps will usually lessen or disappear. Secondary dysmenorrhea begins later in life, lasts longer, and the pain may be stronger than primary dysmenorrhea. The pain may start before the period and last a few days after the period. What are the causes? Dysmenorrhea is usually caused by an underlying problem, such as:  The tissue lining the uterus grows outside of the uterus in other areas of the body (endometriosis).  The endometrial tissue, which normally lines the uterus, is found in or grows into the muscular walls of the uterus (adenomyosis).  The pelvic blood vessels are engorged with blood just before the menstrual period (pelvic congestive syndrome).  Overgrowth of cells (polyps) in the lining of the uterus or cervix.  Falling down of the uterus (prolapse) because of loose or stretched ligaments.  Depression.  Bladder problems, infection, or inflammation.  Problems with the intestine, a tumor, or irritable bowel syndrome.  Cancer of the female organs or bladder.  A severely tipped uterus.  A very tight opening or closed cervix.  Noncancerous tumors of the uterus (fibroids).  Pelvic inflammatory disease (PID).  Pelvic scarring (adhesions) from a previous surgery.  Ovarian cyst.  An intrauterine device (IUD) used for birth control. What increases the risk? You may be at greater risk of dysmenorrhea if:  You are younger than age 36.  You started puberty  early.  You have irregular or heavy bleeding.  You have never given birth.  You have a family history of this problem.  You are a smoker. What are the signs or symptoms?  Cramping or throbbing pain in your lower abdomen.  Headaches.  Lower back pain.  Nausea or vomiting.  Diarrhea.  Sweating or dizziness.  Loose stools. How is this diagnosed? A diagnosis is based on your history, symptoms, physical exam, diagnostic tests, or procedures. Diagnostic tests or procedures may include:  Blood tests.  Ultrasonography.  An examination of the lining of the uterus (dilation and curettage, D&C).  An examination inside your abdomen or pelvis with a scope (laparoscopy).  X-rays.  CT scan.  MRI.  An examination inside the bladder with a scope (cystoscopy).  An examination inside the intestine or stomach with a scope (colonoscopy, gastroscopy). How is this treated? Treatment depends on the cause of the dysmenorrhea. Treatment may include:  Pain medicine prescribed by your health care provider.  Birth control pills or an IUD with progesterone hormone in it.  Hormone replacement therapy.  Nonsteroidal anti-inflammatory drugs (NSAIDs). These may help stop the production of prostaglandins.  Surgery to remove adhesions, endometriosis, ovarian cyst, or fibroids.  Removal of the uterus (hysterectomy).  Progesterone shots to stop the menstrual period.  Cutting the nerves on the sacrum that go to the female organs (presacral neurectomy).  Electric current to the sacral nerves (sacral nerve stimulation).  Antidepressant medicine.  Psychiatric therapy, counseling, or group therapy.  Exercise and physical therapy.  Meditation and yoga therapy.  Acupuncture. Follow these instructions at home:  Only take over-the-counter or prescription medicines as directed  by your health care provider.  Place a heating pad or hot water bottle on your lower back or abdomen. Do not  sleep with the heating pad.  Use aerobic exercises, walking, swimming, biking, and other exercises to help lessen the cramping.  Massage to the lower back or abdomen may help.  Stop smoking.  Avoid alcohol and caffeine. Contact a health care provider if:  Your pain does not get better with medicine.  You have pain with sexual intercourse.  Your pain increases and is not controlled with medicines.  You have abnormal vaginal bleeding with your period.  You develop nausea or vomiting with your period that is not controlled with medicine. Get help right away if: You pass out. This information is not intended to replace advice given to you by your health care provider. Make sure you discuss any questions you have with your health care provider. Document Released: 11/16/2005 Document Revised: 04/23/2016 Document Reviewed: 05/04/2013 Elsevier Interactive Patient Education  2017 [employer].

## 2017-03-07 NOTE — Progress Notes (Signed)
Bimanual only

## 2017-04-01 MED FILL — NAPROXEN 500 MG TABLET: 500 | 15 days supply | Qty: 30 | Fill #0

## 2017-06-27 DIAGNOSIS — B373 Candidiasis of vulva and vagina: Secondary | ICD-10-CM | POA: Diagnosis not present

## 2017-06-27 DIAGNOSIS — Z202 Contact with and (suspected) exposure to infections with a predominantly sexual mode of transmission: Secondary | ICD-10-CM | POA: Diagnosis not present

## 2017-06-27 DIAGNOSIS — B9689 Other specified bacterial agents as the cause of diseases classified elsewhere: Secondary | ICD-10-CM | POA: Diagnosis not present

## 2017-06-27 DIAGNOSIS — N760 Acute vaginitis: Secondary | ICD-10-CM | POA: Diagnosis not present

## 2017-07-09 ENCOUNTER — Other Ambulatory Visit: Payer: Self-pay | Admitting: Certified Nurse Midwife

## 2017-07-09 DIAGNOSIS — N946 Dysmenorrhea, unspecified: Secondary | ICD-10-CM

## 2017-07-09 MED ORDER — NAPROXEN 500 MG PO TABS: 500.0000 mg | ORAL_TABLET | Freq: Two times a day (BID) | ORAL | 5 refills | Status: DC

## 2017-07-09 MED FILL — NAPROXEN 500 MG TABLET: 500 | 30 days supply | Qty: 60 | Fill #0

## 2017-07-30 DIAGNOSIS — Z01419 Encounter for gynecological examination (general) (routine) without abnormal findings: Secondary | ICD-10-CM | POA: Diagnosis not present

## 2017-07-30 DIAGNOSIS — Z6822 Body mass index (BMI) 22.0-22.9, adult: Secondary | ICD-10-CM | POA: Diagnosis not present

## 2017-07-30 DIAGNOSIS — Z1151 Encounter for screening for human papillomavirus (HPV): Secondary | ICD-10-CM | POA: Diagnosis not present

## 2017-07-30 DIAGNOSIS — Z1231 Encounter for screening mammogram for malignant neoplasm of breast: Secondary | ICD-10-CM | POA: Diagnosis not present

## 2017-09-17 ENCOUNTER — Other Ambulatory Visit (HOSPITAL_COMMUNITY): Payer: Self-pay | Admitting: Obstetrics and Gynecology

## 2017-09-17 ENCOUNTER — Ambulatory Visit (HOSPITAL_COMMUNITY)
Admission: RE | Admit: 2017-09-17 | Discharge: 2017-09-17 | Disposition: A | Discharge status: Home / Self Care | Payer: 59 | Source: Ambulatory Visit | Attending: Obstetrics and Gynecology | Admitting: Obstetrics and Gynecology

## 2017-09-17 DIAGNOSIS — D259 Leiomyoma of uterus, unspecified: Secondary | ICD-10-CM | POA: Diagnosis not present

## 2017-09-17 DIAGNOSIS — N83201 Unspecified ovarian cyst, right side: Secondary | ICD-10-CM | POA: Insufficient documentation

## 2017-09-17 DIAGNOSIS — N946 Dysmenorrhea, unspecified: Secondary | ICD-10-CM

## 2017-10-19 ENCOUNTER — Other Ambulatory Visit: Payer: Self-pay | Admitting: Certified Nurse Midwife

## 2017-10-19 DIAGNOSIS — N946 Dysmenorrhea, unspecified: Secondary | ICD-10-CM

## 2017-10-19 MED ORDER — TRAMADOL HCL 50 MG PO TABS: 50.0000 mg | ORAL_TABLET | Freq: Four times a day (QID) | ORAL | 0 refills | Status: DC | PRN

## 2017-10-19 MED FILL — traMADol HCL 50 MG TABS: 50 | 15 days supply | Qty: 60 | Fill #0

## 2017-11-09 ENCOUNTER — Other Ambulatory Visit: Payer: Self-pay | Admitting: Obstetrics and Gynecology

## 2017-11-26 ENCOUNTER — Ambulatory Visit (HOSPITAL_COMMUNITY)
Admission: RE | Admit: 2017-11-26 | Payer: Commercial Managed Care - PPO | Source: Ambulatory Visit | Admitting: Obstetrics and Gynecology

## 2017-11-26 ENCOUNTER — Encounter (HOSPITAL_COMMUNITY): Admission: RE | Payer: Self-pay | Source: Ambulatory Visit

## 2017-11-26 SURGERY — ROBOTIC ASSISTED TOTAL HYSTERECTOMY WITH BILATERAL SALPINGO OOPHORECTOMY
Anesthesia: General | Laterality: Bilateral

## 2017-12-31 DIAGNOSIS — H524 Presbyopia: Secondary | ICD-10-CM | POA: Diagnosis not present

## 2018-01-28 ENCOUNTER — Other Ambulatory Visit: Payer: Self-pay | Admitting: Certified Nurse Midwife

## 2018-01-28 DIAGNOSIS — N939 Abnormal uterine and vaginal bleeding, unspecified: Secondary | ICD-10-CM

## 2018-01-28 MED ORDER — MEDROXYPROGESTERONE ACETATE 150 MG/ML IM SUSP: 150.0000 mg | INTRAMUSCULAR | 4 refills | Status: DC

## 2018-01-28 MED FILL — medroxyPROGESTERone ACETATE: 150 | 90 days supply | Qty: 1 | Fill #0

## 2018-01-28 NOTE — Progress Notes (Unsigned)
dep

## 2018-02-16 MED FILL — ERYTHROMYCIN EYE OINTMENT: 5 | 7 days supply | Qty: 4 | Fill #0

## 2018-03-14 IMAGING — US US TRANSVAGINAL NON-OB
1 series · 15 of 25 positions shown · non-contrast
Comparison: 11/05/2013

CLINICAL DATA: Dysmenorrhea

EXAM:
ULTRASOUND PELVIS TRANSVAGINAL
TECHNIQUE: Transvaginal ultrasound examination of the pelvis was performed
including evaluation of the uterus, ovaries, adnexal regions, and
pelvic cul-de-sac.

[Series 1: us transvaginal non-ob · 15 of 53 slices shown]
[im 1/53]
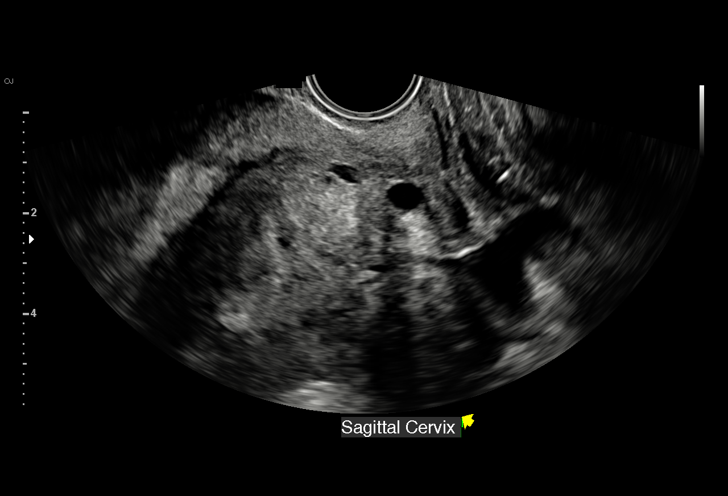
[im 5/53]
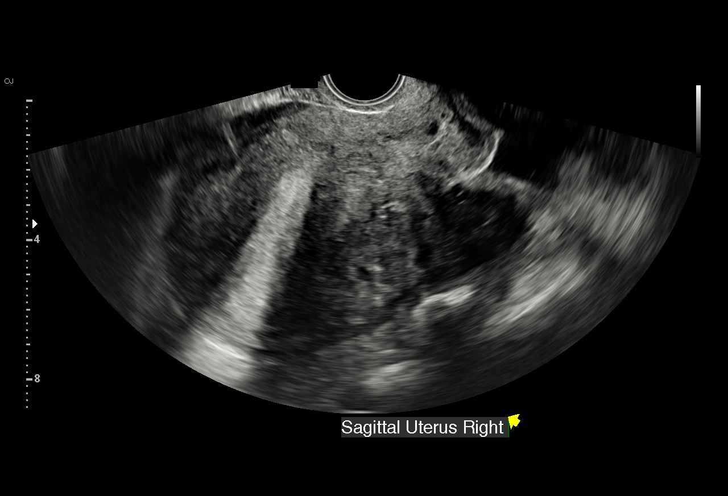
[im 9/53]
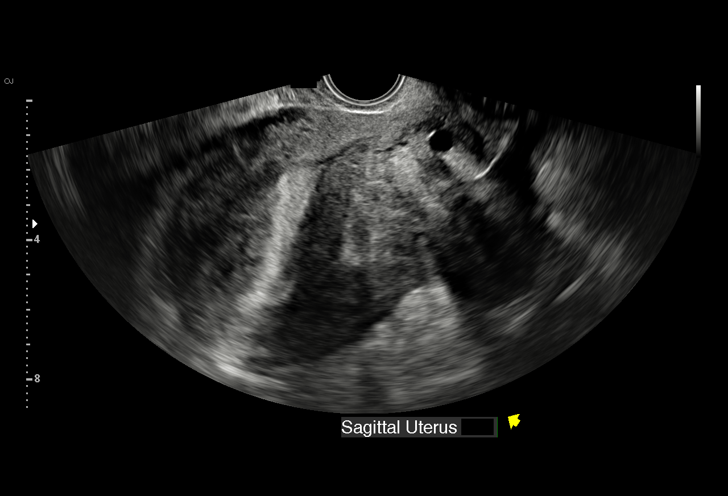
[im 11/53]
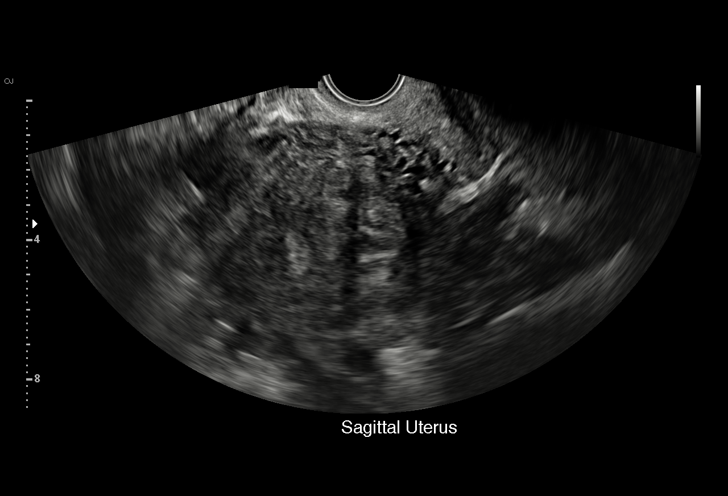
[im 16/53]
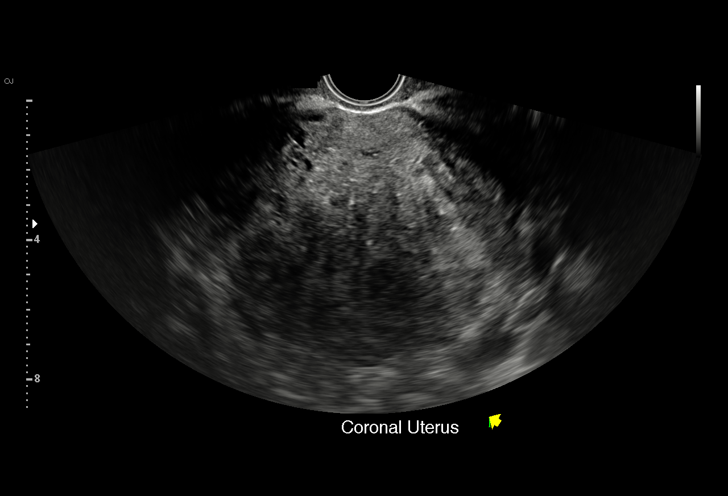
[im 20/53]
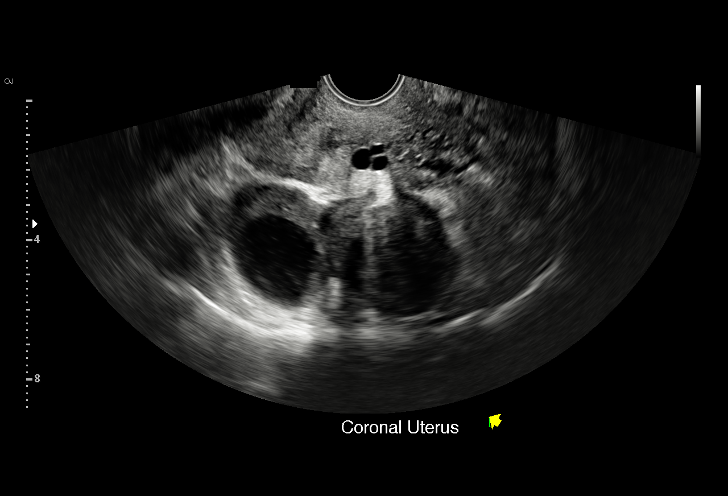
[im 22/53]
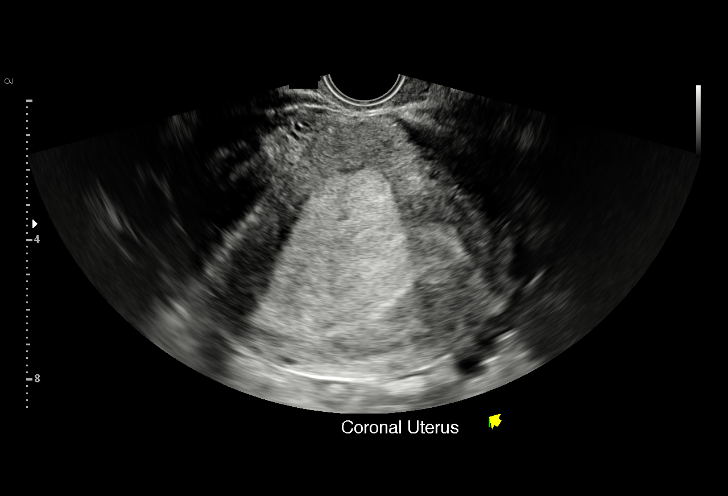
[im 27/53]
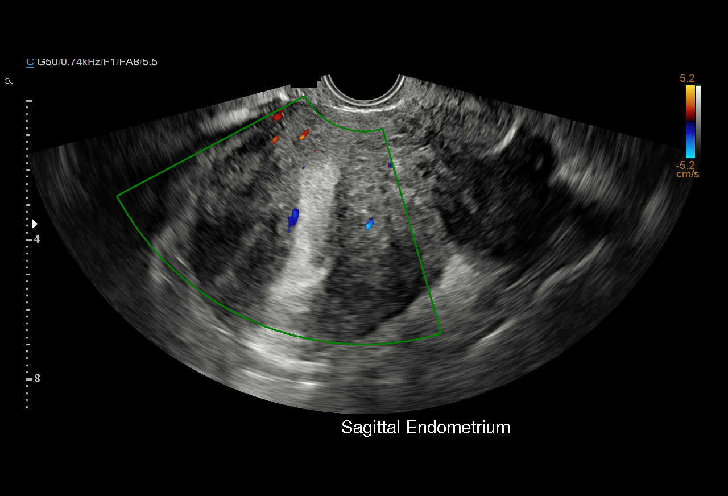
[im 31/53]
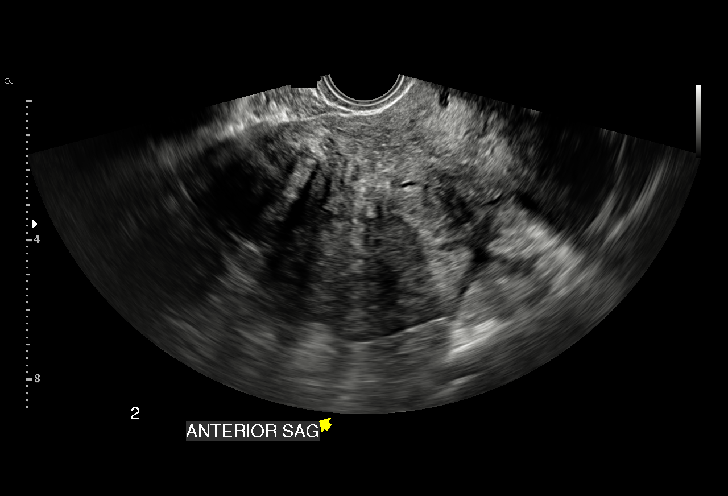
[im 33/53]
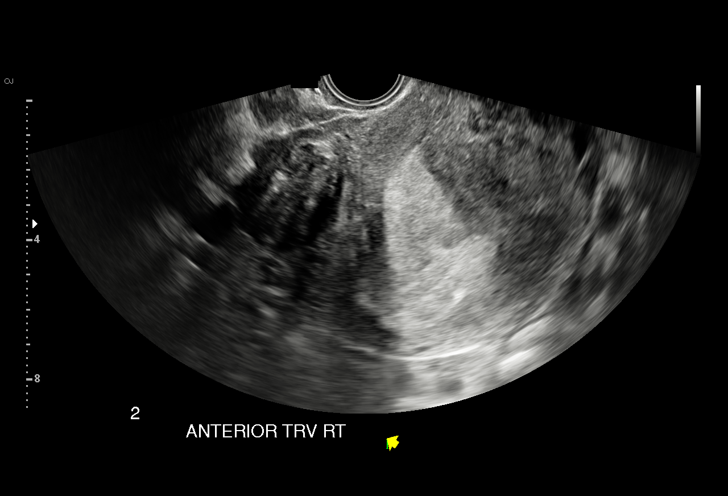
[im 37/53]
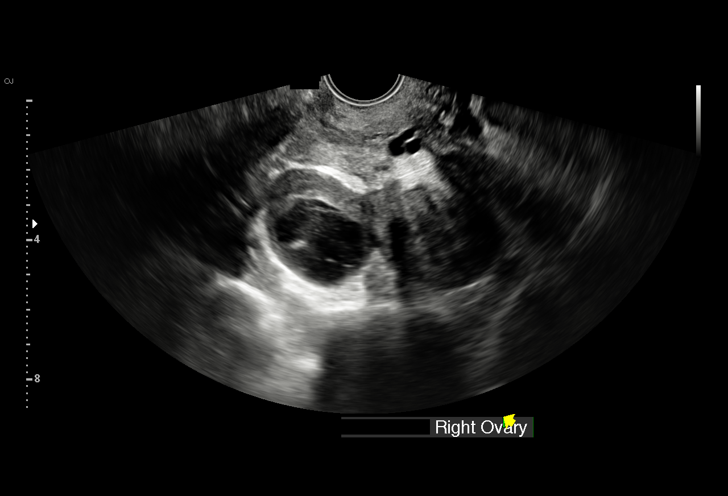
[im 42/53]
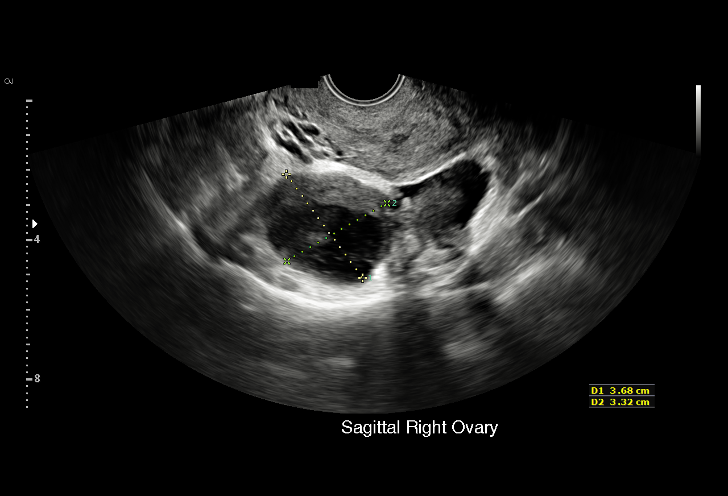
[im 44/53]
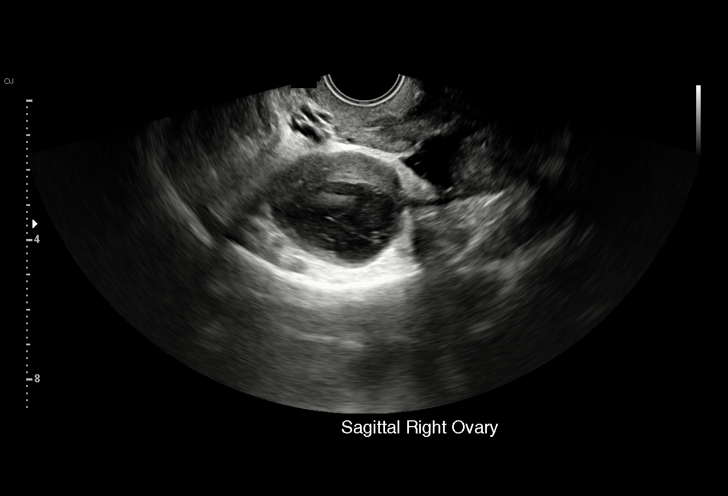
[im 48/53]
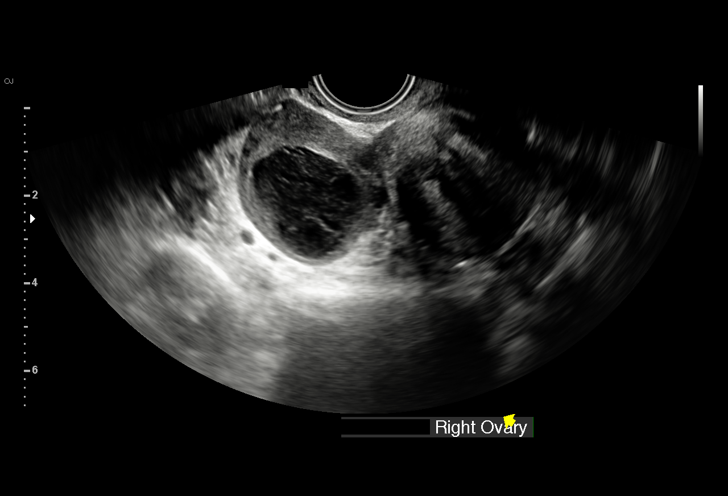
[im 53/53]
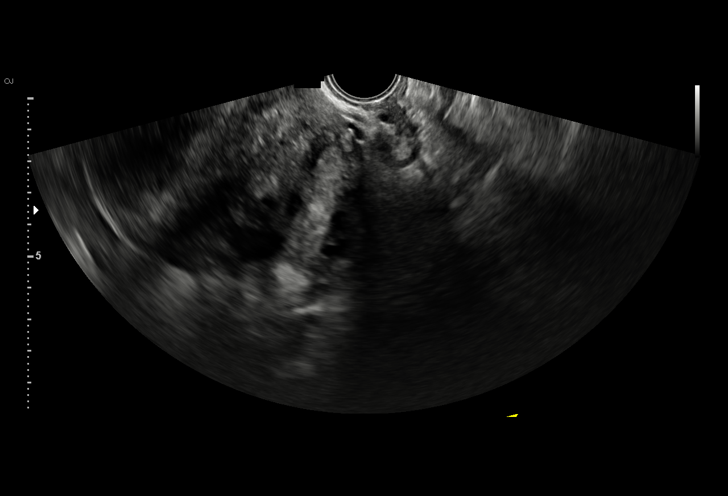

[15 of 25 positions shown; findings below may reference images not displayed]

FINDINGS: Uterus

Measurements: 11.0 x 7.6 x 10.3 cm. 3.3 cm posterior lower uterine
segment subserosal fibroid. 3.7 cm right anterior fundal subserosal
fibroid.

Endometrium

Thickness: 18 mm in thickness.  No focal abnormality visualized.

Right ovary

Measurements: 3.7 x 3.3 x 3.1 cm. 2.7 cm complex dominant follicle
or hemorrhagic cyst.

Left ovary

Measurements: Not visualized. No adnexal mass seen.

Other findings:  Trace free fluid in the pelvis.
IMPRESSION: Fibroid uterus as above.

Small hemorrhagic cyst or dominant follicle in the right ovary.

## 2018-04-27 MED FILL — medroxyPROGESTERone ACETATE: 150 | 90 days supply | Qty: 1 | Fill #1

## 2018-05-13 ENCOUNTER — Ambulatory Visit (INDEPENDENT_AMBULATORY_CARE_PROVIDER_SITE_OTHER): Payer: 59 | Admitting: Family Medicine

## 2018-05-13 ENCOUNTER — Encounter: Payer: Self-pay | Admitting: Family Medicine

## 2018-05-13 VITALS — BP 122/84 | HR 80 | Temp 97.9°F | Ht 67.0 in | Wt 139.6 lb

## 2018-05-13 DIAGNOSIS — Z0000 Encounter for general adult medical examination without abnormal findings: Secondary | ICD-10-CM | POA: Insufficient documentation

## 2018-05-13 DIAGNOSIS — Z13220 Encounter for screening for lipoid disorders: Secondary | ICD-10-CM

## 2018-05-13 LAB — COMPREHENSIVE METABOLIC PANEL
ALT: 15 U/L (ref 0–35)
AST: 18 U/L (ref 0–37)
Albumin: 4.2 g/dL (ref 3.5–5.2)
Alkaline Phosphatase: 61 U/L (ref 39–117)
BUN: 13 mg/dL (ref 6–23)
CO2: 26 mEq/L (ref 19–32)
Calcium: 9.7 mg/dL (ref 8.4–10.5)
Chloride: 108 mEq/L (ref 96–112)
Creatinine, Ser: 0.71 mg/dL (ref 0.40–1.20)
GFR: 113.43 mL/min (ref >60.00)
Glucose, Bld: 88 mg/dL (ref 70–99)
Potassium: 4.4 mEq/L (ref 3.5–5.1)
Sodium: 141 mEq/L (ref 135–145)
Total Bilirubin: 0.4 mg/dL (ref 0.2–1.2)
Total Protein: 7.2 g/dL (ref 6.0–8.3)

## 2018-05-13 LAB — CBC
HCT: 36.7 % (ref 36.0–46.0)
Hemoglobin: 11.7 g/dL — ABNORMAL LOW (ref 12.0–15.0)
MCHC: 31.8 g/dL (ref 30.0–36.0)
MCV: 74.1 fl — ABNORMAL LOW (ref 78.0–100.0)
Platelets: 255 10*3/uL (ref 150.0–400.0)
RBC: 4.96 Mil/uL (ref 3.87–5.11)
RDW: 29.3 % — ABNORMAL HIGH (ref 11.5–15.5)
WBC: 5.8 10*3/uL (ref 4.0–10.5)

## 2018-05-13 LAB — LIPID PANEL
Cholesterol: 156 mg/dL (ref 0–200)
HDL: 45.1 mg/dL (ref >39.00)
LDL Cholesterol: 100 mg/dL — ABNORMAL HIGH (ref 0–99)
NonHDL: 111.28
Total CHOL/HDL Ratio: 3
Triglycerides: 58 mg/dL (ref 0.0–149.0)
VLDL: 11.6 mg/dL (ref 0.0–40.0)

## 2018-05-13 LAB — TSH: TSH: 1.48 u[IU]/mL (ref 0.35–4.50)

## 2018-05-13 NOTE — Progress Notes (Signed)
[name] - 47 y.o. female MRN [id number]  Date of birth: 1971/07/24  Subjective Chief Complaint  Patient presents with  . Establish Care    est care/CPE--not fasting--ate 5 hrs/has GYN--on her period for 2 mo now    HPI [name] is a 47 y.o. female with a history of anemia, fibroids and endometriosis here today for annual exam.  She is followed by Gyn for management of fibroids and plans to have hysterectomy later this year due to heavy menstrual cycles and anemia.  She also has her mammograms completed through her GYN office.  She denies any additional concerns today.  She does try to follow a healthy diet but admits that activity level could be better.  She is up to date on immunization and report having a Tdap within the past 10 years.    Review of Systems  Constitutional: Negative for chills, fever, malaise/fatigue and weight loss.  HENT: Negative for congestion, ear pain and sore throat.   Eyes: Negative for blurred vision, double vision and pain.  Respiratory: Negative for cough and shortness of breath.   Cardiovascular: Negative for chest pain and palpitations.  Gastrointestinal: Negative for abdominal pain, blood in stool, constipation, heartburn and nausea.  Genitourinary: Negative for dysuria and urgency.  Musculoskeletal: Negative for joint pain and myalgias.  Neurological: Negative for dizziness and headaches.  Endo/Heme/Allergies: Does not bruise/bleed easily.  Psychiatric/Behavioral: Negative for depression. The patient is not nervous/anxious and does not have insomnia.     No Known Allergies  Past Medical History:  Diagnosis Date  . Abnormal Pap smear   . Anemia   . Endometriosis   . Fibroids   . Ovarian cyst     Past Surgical History:  Procedure Laterality Date  .  diagnostic laparscopic  06/2004  . CHROMOPERTUBATION N/A 11/28/2013   Procedure: CHROMOPERTUBATION, ;  Surgeon: Lovenia Kim, MD;  Location: [facility];  Service: Gynecology;   Laterality: N/A;  . DIAGNOSTIC LAPAROSCOPY  2005   [place]  . OVARIAN CYST REMOVAL  2015  . ROBOTIC ASSISTED LAPAROSCOPIC LYSIS OF ADHESION N/A 11/28/2013   Procedure: ROBOTIC ASSISTED LAPAROSCOPIC LYSIS OF ADHESION; EXCISION OF RIGHT OVARIAN CYST WALL AND MYOMECTOMY;  Surgeon: Lovenia Kim, MD;  Location: [facility];  Service: Gynecology;  Laterality: N/A;  . UTERINE FIBROID SURGERY  2015  . WISDOM TOOTH EXTRACTION      Social History   Socioeconomic History  . Marital status: Married    Spouse name: Not on file  . Number of children: Not on file  . Years of education: Not on file  . Highest education level: Not on file  Occupational History  . Not on file  Social Needs  . Financial resource strain: Not on file  . Food insecurity:    Worry: Not on file    Inability: Not on file  . Transportation needs:    Medical: Not on file    Non-medical: Not on file  Tobacco Use  . Smoking status: Never Smoker  . Smokeless tobacco: Never Used  Substance and Sexual Activity  . Alcohol use: No  . Drug use: No  . Sexual activity: Yes    Birth control/protection: None  Lifestyle  . Physical activity:    Days per week: Not on file    Minutes per session: Not on file  . Stress: Not on file  Relationships  . Social connections:    Talks on phone: Not on file    Gets  together: Not on file    Attends religious service: Not on file    Active member of club or organization: Not on file    Attends meetings of clubs or organizations: Not on file    Relationship status: Not on file  Other Topics Concern  . Not on file  Social History Narrative  . Not on file    Family History  Problem Relation Age of Onset  . Hypertension Mother   . Stroke Mother   . Hypertension Father   . Heart disease Father   . Stroke Maternal Grandfather   . Hypertension Maternal Grandfather   . Cancer Paternal Grandmother        stomach cancer  . Hyperlipidemia Paternal Grandmother     Health  Maintenance  Topic Date Due  . PAP SMEAR  04/06/1992  . HIV Screening  05/14/2019 (Originally 04/06/1986)  . INFLUENZA VACCINE  06/30/2018  . TETANUS/TDAP  06/30/2026    ----------------------------------------------------------------------------------------------------------------------------------------------------------------------------------------------------------------- Physical Exam BP 122/84   Pulse 80   Temp 97.9 F (36.6 C) (Oral)   Ht _0  (1.702 m)   Wt 139 lb 9.6 oz (63.3 kg)   LMP 05/13/2018   SpO2 100%   BMI 21.86 kg/m   Physical Exam  Constitutional: She is oriented to person, place, and time. She appears well-nourished. No distress.  HENT:  Head: Normocephalic and atraumatic.  Nose: Nose normal.  Mouth/Throat: Oropharynx is clear and moist.  Eyes: Conjunctivae are normal. No scleral icterus.  Neck: Normal range of motion. Neck supple. No thyromegaly present.  Cardiovascular: Normal rate, regular rhythm and normal heart sounds.  Pulmonary/Chest: Effort normal and breath sounds normal.  Abdominal: Soft. Bowel sounds are normal. She exhibits no distension. There is no tenderness. There is no guarding.  Musculoskeletal: Normal range of motion. She exhibits no edema.  Lymphadenopathy:    She has no cervical adenopathy.  Neurological: She is alert and oriented to person, place, and time. No cranial nerve deficit. Coordination normal.  Skin: Skin is warm and dry. No rash noted.  Psychiatric: She has a normal mood and affect. Her behavior is normal.    ------------------------------------------------------------------------------------------------------------------------------------------------------------------------------------------------------------------- Assessment and Plan  Well adult exam Well adult Orders Placed This Encounter  Procedures  . Comp Met (CMET)  . CBC  . TSH  . Lipid Profile  Immunizations: Up to date Screening tests: Up to  date Anticipatory guidance/Risk factor reduction:  Encouraged to work on increased exercise.  Additional recommendations per AVS.

## 2018-05-13 NOTE — Assessment & Plan Note (Signed)
Well adult Orders Placed This Encounter  Procedures  . Comp Met (CMET)  . CBC  . TSH  . Lipid Profile  Immunizations: Up to date Screening tests: Up to date Anticipatory guidance/Risk factor reduction:  Encouraged to work on increased exercise.  Additional recommendations per AVS.

## 2018-05-13 NOTE — Patient Instructions (Signed)

## 2018-05-17 NOTE — Progress Notes (Signed)
Labs show mild anemia, improved from a year ago.  Continue iron supplement.  The rest of her labs are normal.

## 2018-08-26 DIAGNOSIS — Z3042 Encounter for surveillance of injectable contraceptive: Secondary | ICD-10-CM | POA: Diagnosis not present

## 2018-08-26 DIAGNOSIS — Z6822 Body mass index (BMI) 22.0-22.9, adult: Secondary | ICD-10-CM | POA: Diagnosis not present

## 2018-08-26 DIAGNOSIS — Z1231 Encounter for screening mammogram for malignant neoplasm of breast: Secondary | ICD-10-CM | POA: Diagnosis not present

## 2018-08-26 DIAGNOSIS — Z01419 Encounter for gynecological examination (general) (routine) without abnormal findings: Secondary | ICD-10-CM | POA: Diagnosis not present

## 2018-11-10 MED FILL — medroxyPROGESTERone ACETATE: 150 | 90 days supply | Qty: 1 | Fill #3

## 2018-12-06 DIAGNOSIS — N920 Excessive and frequent menstruation with regular cycle: Secondary | ICD-10-CM | POA: Diagnosis not present

## 2018-12-09 ENCOUNTER — Other Ambulatory Visit: Payer: Self-pay | Admitting: Obstetrics and Gynecology

## 2018-12-19 DIAGNOSIS — N939 Abnormal uterine and vaginal bleeding, unspecified: Secondary | ICD-10-CM | POA: Diagnosis not present

## 2018-12-20 ENCOUNTER — Other Ambulatory Visit: Payer: Self-pay | Admitting: Obstetrics and Gynecology

## 2018-12-21 ENCOUNTER — Other Ambulatory Visit: Payer: Self-pay | Admitting: Obstetrics and Gynecology

## 2018-12-29 NOTE — H&P (Signed)
[name], [name] MEDICAL RECORD [id number] ACCOUNT [id number] DATE OF BIRTH:19-Dec-1970 FACILITY: [facility] LOCATION: [facility] PHYSICIAN:Ripley Bogosian J. Ronita Hipps, MD  HISTORY AND PHYSICAL  DATE OF ADMISSION:  12/30/2018  CHIEF COMPLAINT:  Refractory menorrhagia.  HISTORY OF PRESENT ILLNESS:  This 48 year old African-American female G36 who presents now with refractory menorrhagia and known fibroids, negative sonohysterogram for endometrial ablation, diagnostic hysteroscopy, dilatation and curettage.    MEDICATIONS:  She takes no medications.  ALLERGIES:  She has no known drug allergies.  PAST SURGICAL HISTORY:  Remarkable for a da Vinci assisted myomectomy, ablation of endometriosis, history of ulcerative in 2004.  SOCIAL HISTORY:  She is a nonsmoker, nondrinker.  She denies domestic physical violence.  FAMILY HISTORY:  Cerebrovascular disease and chronic hypertension.  PHYSICAL EXAMINATION: GENERAL:  A well-developed, well-nourished African-American female in no acute distress. HEENT:  Normal. NECK:  Supple, full range of motion. LUNGS:  Clear. HEART:  Regular rhythm. ABDOMEN:  Soft, nontender. PELVIC:  A bulky retroflexed uterus and no adnexal masses. EXTREMITIES:  No cords. NEUROLOGIC:  Nonfocal. SKIN:  Intact.  IMPRESSION:  Refractory menorrhagia for definitive therapy.  PLAN:  Diagnostic hysteroscopy, dilatation and curettage, NovaSure ablation, possible HTA ablation.  LN/NUANCE  D:12/29/2018 T:12/29/2018 JOB:[id number]

## 2018-12-30 ENCOUNTER — Ambulatory Visit (HOSPITAL_COMMUNITY): Payer: 59 | Admitting: Certified Registered Nurse Anesthetist

## 2018-12-30 ENCOUNTER — Ambulatory Visit (HOSPITAL_COMMUNITY)
Admission: RE | Admit: 2018-12-30 | Discharge: 2018-12-30 | Disposition: A | Discharge status: Home / Self Care | Payer: 59 | Attending: Obstetrics and Gynecology | Admitting: Obstetrics and Gynecology

## 2018-12-30 ENCOUNTER — Encounter (HOSPITAL_COMMUNITY): Payer: Self-pay

## 2018-12-30 ENCOUNTER — Encounter (HOSPITAL_COMMUNITY)
Admission: RE | Disposition: A | Discharge status: Home / Self Care | Payer: Self-pay | Source: Home / Self Care | Attending: Obstetrics and Gynecology

## 2018-12-30 ENCOUNTER — Other Ambulatory Visit: Payer: Self-pay

## 2018-12-30 DIAGNOSIS — N921 Excessive and frequent menstruation with irregular cycle: Secondary | ICD-10-CM | POA: Insufficient documentation

## 2018-12-30 DIAGNOSIS — D649 Anemia, unspecified: Secondary | ICD-10-CM | POA: Diagnosis not present

## 2018-12-30 DIAGNOSIS — Z793 Long term (current) use of hormonal contraceptives: Secondary | ICD-10-CM | POA: Diagnosis not present

## 2018-12-30 DIAGNOSIS — N920 Excessive and frequent menstruation with regular cycle: Secondary | ICD-10-CM | POA: Diagnosis not present

## 2018-12-30 DIAGNOSIS — N840 Polyp of corpus uteri: Secondary | ICD-10-CM | POA: Insufficient documentation

## 2018-12-30 DIAGNOSIS — D259 Leiomyoma of uterus, unspecified: Secondary | ICD-10-CM | POA: Diagnosis not present

## 2018-12-30 HISTORY — PX: DILATATION & CURETTAGE/HYSTEROSCOPY WITH MYOSURE: SHX6511

## 2018-12-30 HISTORY — PX: HYSTEROSCOPY WITH NOVASURE: SHX5574

## 2018-12-30 LAB — BASIC METABOLIC PANEL
Anion gap: 6 (ref 5–15)
BUN: 9 mg/dL (ref 6–20)
CO2: 22 mmol/L (ref 22–32)
Calcium: 9 mg/dL (ref 8.9–10.3)
Chloride: 108 mmol/L (ref 98–111)
Creatinine, Ser: 0.66 mg/dL (ref 0.44–1.00)
GFR calc Af Amer: >60 mL/min (ref >60)
GFR calc non Af Amer: >60 mL/min (ref >60)
Glucose, Bld: 80 mg/dL (ref 70–99)
Potassium: 3.5 mmol/L (ref 3.5–5.1)
Sodium: 136 mmol/L (ref 135–145)

## 2018-12-30 LAB — CBC
HCT: 35.5 % — ABNORMAL LOW (ref 36.0–46.0)
Hemoglobin: 10.8 g/dL — ABNORMAL LOW (ref 12.0–15.0)
MCH: 23 pg — ABNORMAL LOW (ref 26.0–34.0)
MCHC: 30.4 g/dL (ref 30.0–36.0)
MCV: 75.7 fL — ABNORMAL LOW (ref 80.0–100.0)
Platelets: 387 10*3/uL (ref 150–400)
RBC: 4.69 MIL/uL (ref 3.87–5.11)
RDW: 17.2 % — ABNORMAL HIGH (ref 11.5–15.5)
WBC: 5.4 10*3/uL (ref 4.0–10.5)
nRBC: 0 % (ref 0.0–0.2)

## 2018-12-30 SURGERY — DILATATION & CURETTAGE/HYSTEROSCOPY WITH MYOSURE
Anesthesia: General

## 2018-12-30 MED ORDER — LACTATED RINGERS IV SOLN
INTRAVENOUS | Status: DC
  Administered 2018-12-30: 125 mL/h via INTRAVENOUS
  Administered 2018-12-30: 15:00:00 via INTRAVENOUS

## 2018-12-30 MED ORDER — CEFAZOLIN SODIUM-DEXTROSE 2-4 GM/100ML-% IV SOLN
INTRAVENOUS | Status: AC
  Filled 2018-12-30: qty 100

## 2018-12-30 MED ORDER — KETOROLAC TROMETHAMINE 30 MG/ML IJ SOLN
INTRAMUSCULAR | Status: AC
  Filled 2018-12-30: qty 1

## 2018-12-30 MED ORDER — VASOPRESSIN 20 UNIT/ML IV SOLN
INTRAVENOUS | Status: DC | PRN
  Administered 2018-12-30: 20 [IU]

## 2018-12-30 MED ORDER — PROPOFOL 10 MG/ML IV BOLUS
INTRAVENOUS | Status: AC
  Filled 2018-12-30: qty 20

## 2018-12-30 MED ORDER — MIDAZOLAM HCL 2 MG/2ML IJ SOLN
INTRAMUSCULAR | Status: DC | PRN
  Administered 2018-12-30: 2 mg via INTRAVENOUS

## 2018-12-30 MED ORDER — KETOROLAC TROMETHAMINE 30 MG/ML IJ SOLN: 30.0000 mg | Freq: Once | INTRAMUSCULAR | Status: DC | PRN

## 2018-12-30 MED ORDER — FENTANYL CITRATE (PF) 100 MCG/2ML IJ SOLN
INTRAMUSCULAR | Status: AC
  Filled 2018-12-30: qty 2

## 2018-12-30 MED ORDER — FENTANYL CITRATE (PF) 100 MCG/2ML IJ SOLN
INTRAMUSCULAR | Status: DC | PRN
  Administered 2018-12-30 (×2): 25 ug via INTRAVENOUS

## 2018-12-30 MED ORDER — PROPOFOL 10 MG/ML IV BOLUS
INTRAVENOUS | Status: DC | PRN
  Administered 2018-12-30: 200 mg via INTRAVENOUS

## 2018-12-30 MED ORDER — OXYCODONE HCL 5 MG/5ML PO SOLN: 5.0000 mg | Freq: Once | ORAL | Status: DC | PRN

## 2018-12-30 MED ORDER — SCOPOLAMINE 1 MG/3DAYS TD PT72
MEDICATED_PATCH | TRANSDERMAL | Status: AC
  Filled 2018-12-30: qty 1

## 2018-12-30 MED ORDER — SODIUM CHLORIDE (PF) 0.9 % IJ SOLN
INTRAMUSCULAR | Status: AC
  Filled 2018-12-30: qty 50

## 2018-12-30 MED ORDER — LIDOCAINE HCL (CARDIAC) PF 100 MG/5ML IV SOSY
PREFILLED_SYRINGE | INTRAVENOUS | Status: DC | PRN
  Administered 2018-12-30: 60 mg via INTRAVENOUS

## 2018-12-30 MED ORDER — DEXAMETHASONE SODIUM PHOSPHATE 10 MG/ML IJ SOLN
INTRAMUSCULAR | Status: AC
  Filled 2018-12-30: qty 1

## 2018-12-30 MED ORDER — MIDAZOLAM HCL 2 MG/2ML IJ SOLN
INTRAMUSCULAR | Status: AC
  Filled 2018-12-30: qty 2

## 2018-12-30 MED ORDER — ONDANSETRON HCL 4 MG/2ML IJ SOLN
INTRAMUSCULAR | Status: DC | PRN
  Administered 2018-12-30: 4 mg via INTRAVENOUS

## 2018-12-30 MED ORDER — CEFAZOLIN SODIUM-DEXTROSE 2-4 GM/100ML-% IV SOLN
2.0000 g | INTRAVENOUS | Status: AC
  Administered 2018-12-30: 2 g via INTRAVENOUS

## 2018-12-30 MED ORDER — OXYCODONE HCL 5 MG PO TABS: 5.0000 mg | ORAL_TABLET | Freq: Once | ORAL | Status: DC | PRN

## 2018-12-30 MED ORDER — ONDANSETRON HCL 4 MG/2ML IJ SOLN
INTRAMUSCULAR | Status: AC
  Filled 2018-12-30: qty 2

## 2018-12-30 MED ORDER — TRAMADOL HCL 50 MG PO TABS: 50.0000 mg | ORAL_TABLET | Freq: Four times a day (QID) | ORAL | 0 refills | Status: DC | PRN

## 2018-12-30 MED ORDER — MEPERIDINE HCL 25 MG/ML IJ SOLN: 6.2500 mg | INTRAMUSCULAR | Status: DC | PRN

## 2018-12-30 MED ORDER — ONDANSETRON HCL 4 MG/2ML IJ SOLN: 4.0000 mg | Freq: Once | INTRAMUSCULAR | Status: DC | PRN

## 2018-12-30 MED ORDER — FENTANYL CITRATE (PF) 100 MCG/2ML IJ SOLN
25.0000 ug | INTRAMUSCULAR | Status: DC | PRN
  Administered 2018-12-30: 50 ug via INTRAVENOUS

## 2018-12-30 MED ORDER — BUPIVACAINE HCL (PF) 0.25 % IJ SOLN
INTRAMUSCULAR | Status: AC
  Filled 2018-12-30: qty 30

## 2018-12-30 MED ORDER — ACETAMINOPHEN 160 MG/5ML PO SOLN: 325.0000 mg | ORAL | Status: DC | PRN

## 2018-12-30 MED ORDER — FENTANYL CITRATE (PF) 100 MCG/2ML IJ SOLN
INTRAMUSCULAR | Status: AC
  Administered 2018-12-30: 50 ug via INTRAVENOUS
  Filled 2018-12-30: qty 2

## 2018-12-30 MED ORDER — ACETAMINOPHEN 325 MG PO TABS: 325.0000 mg | ORAL_TABLET | ORAL | Status: DC | PRN

## 2018-12-30 MED ORDER — BUPIVACAINE HCL (PF) 0.25 % IJ SOLN
INTRAMUSCULAR | Status: DC | PRN
  Administered 2018-12-30: 20 mL

## 2018-12-30 MED ORDER — KETOROLAC TROMETHAMINE 30 MG/ML IJ SOLN
INTRAMUSCULAR | Status: DC | PRN
  Administered 2018-12-30: 30 mg via INTRAVENOUS

## 2018-12-30 MED ORDER — VASOPRESSIN 20 UNIT/ML IV SOLN
INTRAVENOUS | Status: AC
  Filled 2018-12-30: qty 1

## 2018-12-30 MED ORDER — LIDOCAINE HCL (PF) 1 % IJ SOLN
INTRAMUSCULAR | Status: AC
  Filled 2018-12-30: qty 5

## 2018-12-30 MED ORDER — SCOPOLAMINE 1 MG/3DAYS TD PT72
1.0000 | MEDICATED_PATCH | Freq: Once | TRANSDERMAL | Status: DC
  Administered 2018-12-30: 1.5 mg via TRANSDERMAL

## 2018-12-30 MED ORDER — DEXAMETHASONE SODIUM PHOSPHATE 10 MG/ML IJ SOLN
INTRAMUSCULAR | Status: DC | PRN
  Administered 2018-12-30: 10 mg via INTRAVENOUS

## 2018-12-30 SURGICAL SUPPLY — 19 items
ABLATOR SURESOUND NOVASURE (ABLATOR) ×2 IMPLANT
CATH ROBINSON RED A/P 16FR (CATHETERS) ×2 IMPLANT
DECANTER SPIKE VIAL GLASS SM (MISCELLANEOUS) ×4 IMPLANT
DEVICE MYOSURE LITE (MISCELLANEOUS) IMPLANT
DEVICE MYOSURE REACH (MISCELLANEOUS) IMPLANT
GLOVE BIO SURGEON STRL SZ7.5 (GLOVE) ×2 IMPLANT
GLOVE BIOGEL PI IND STRL 7.0 (GLOVE) ×1 IMPLANT
GLOVE BIOGEL PI INDICATOR 7.0 (GLOVE) ×1
GOWN STRL REUS W/TWL LRG LVL3 (GOWN DISPOSABLE) ×4 IMPLANT
HIBICLENS CHG 4% 4OZ BTL (MISCELLANEOUS) ×2 IMPLANT
KIT PROCEDURE FLUENT (KITS) ×2 IMPLANT
NEEDLE SPNL 22GX3.5 QUINCKE BK (NEEDLE) ×2 IMPLANT
PACK VAGINAL MINOR WOMEN LF (CUSTOM PROCEDURE TRAY) ×2 IMPLANT
PAD OB MATERNITY 4.3X12.25 (PERSONAL CARE ITEMS) ×2 IMPLANT
PAD PREP 24X48 CUFFED NSTRL (MISCELLANEOUS) ×2 IMPLANT
SEAL ROD LENS SCOPE MYOSURE (ABLATOR) ×2 IMPLANT
SYR CONTROL 10ML LL (SYRINGE) ×2 IMPLANT
SYR TB 1ML 25GX5/8 (SYRINGE) ×2 IMPLANT
TOWEL OR 17X24 6PK STRL BLUE (TOWEL DISPOSABLE) ×4 IMPLANT

## 2018-12-30 NOTE — Anesthesia Preprocedure Evaluation (Signed)
Anesthesia Evaluation  Patient identified by MRN, date of birth, ID band Patient awake    Reviewed: Allergy & Precautions, H&P , NPO status , Patient's Chart, lab work & pertinent test results  Airway Mallampati: I  TM Distance: >3 FB Neck ROM: full    Dental  (+) Teeth Intact   Pulmonary neg pulmonary ROS,    breath sounds clear to auscultation       Cardiovascular negative cardio ROS Normal cardiovascular exam Rhythm:regular Rate:Normal     Neuro/Psych negative neurological ROS  negative psych ROS   GI/Hepatic negative GI ROS, Neg liver ROS,   Endo/Other  negative endocrine ROS  Renal/GU negative Renal ROS     Musculoskeletal   Abdominal Normal abdominal exam  (+)   Peds  Hematology  (+) Blood dyscrasia, anemia ,   Anesthesia Other Findings   Reproductive/Obstetrics negative OB ROS                             Anesthesia Physical Anesthesia Plan  ASA: II  Anesthesia Plan: General   Post-op Pain Management:    Induction: Intravenous  PONV Risk Score and Plan: 4 or greater and Ondansetron, Dexamethasone, Scopolamine patch - Pre-op and Midazolam  Airway Management Planned: LMA  Additional Equipment:   Intra-op Plan:   Post-operative Plan: Extubation in OR  Informed Consent: I have reviewed the patients History and Physical, chart, labs and discussed the procedure including the risks, benefits and alternatives for the proposed anesthesia with the patient or authorized representative who has indicated his/her understanding and acceptance.     Dental Advisory Given  Plan Discussed with: CRNA  Anesthesia Plan Comments:         Anesthesia Quick Evaluation

## 2018-12-30 NOTE — Discharge Instructions (Signed)
DISCHARGE INSTRUCTIONS: D&C / D&E The following instructions have been prepared to help you care for yourself upon your return home.   Personal hygiene:  Use sanitary pads for vaginal drainage, not tampons.  Shower the day after your procedure.  NO tub baths, pools or Jacuzzis for 2-3 weeks.  Wipe front to back after using the bathroom.  Activity and limitations:  Do NOT drive or operate any equipment for 24 hours. The effects of anesthesia are still present and drowsiness may result.  Do NOT rest in bed all day.  Walking is encouraged.  Walk up and down stairs slowly.  You may resume your normal activity in one to two days or as indicated by your physician.  Sexual activity: NO intercourse for at least 2 weeks after the procedure, or as indicated by your physician.  Diet: Eat a light meal as desired this evening. You may resume your usual diet tomorrow.  Return to work: You may resume your work activities in one to two days or as indicated by your doctor.  What to expect after your surgery: Expect to have vaginal bleeding/discharge for 2-3 days and spotting for up to 10 days. It is not unusual to have soreness for up to 1-2 weeks. You may have a slight burning sensation when you urinate for the first day. Mild cramps may continue for a couple of days. You may have a regular period in 2-6 weeks.  Call your doctor for any of the following:  Excessive vaginal bleeding, saturating and changing one pad every hour.  Inability to urinate 6 hours after discharge from hospital.  Pain not relieved by pain medication.  Fever of 100.4 F or greater.  Unusual vaginal discharge or odor.   Call for an appointment:    Patients signature: ______________________  Nurses signature ________________________  Support person's signature_______________________   DISCHARGE INSTRUCTIONS: D&C / D&E The following instructions have been prepared to help you care for yourself upon your  return home.   Personal hygiene:  Use sanitary pads for vaginal drainage, not tampons.  Shower the day after your procedure.  NO tub baths, pools or Jacuzzis for 2-3 weeks.  Wipe front to back after using the bathroom.  Activity and limitations:  Do NOT drive or operate any equipment for 24 hours. The effects of anesthesia are still present and drowsiness may result.  Do NOT rest in bed all day.  Walking is encouraged.  Walk up and down stairs slowly.  You may resume your normal activity in one to two days or as indicated by your physician.  Sexual activity: NO intercourse for at least 2 weeks after the procedure, or as indicated by your physician.  Diet: Eat a light meal as desired this evening. You may resume your usual diet tomorrow.  Return to work: You may resume your work activities in one to two days or as indicated by your doctor.  What to expect after your surgery: Expect to have vaginal bleeding/discharge for 2-3 days and spotting for up to 10 days. It is not unusual to have soreness for up to 1-2 weeks. You may have a slight burning sensation when you urinate for the first day. Mild cramps may continue for a couple of days. You may have a regular period in 2-6 weeks.  Call your doctor for any of the following:  Excessive vaginal bleeding, saturating and changing one pad every hour.  Inability to urinate 6 hours after discharge from hospital.  Pain not relieved by  pain medication.  Fever of 100.4 F or greater.  Unusual vaginal discharge or odor.   Call for an appointment:    Patients signature: ______________________  Nurses signature ________________________  Support person's signature_______________________

## 2018-12-30 NOTE — Anesthesia Postprocedure Evaluation (Signed)
Anesthesia Post Note  Patient: [name]  Procedure(s) Performed: DILATATION & CURETTAGE/HYSTEROSCOPY WITH MYOSURE (N/A ) HYSTEROSCOPY WITH NOVASURE, POSSIBLE HYDROTHERMAL ABLATION (N/A )     Patient location during evaluation: PACU Anesthesia Type: General Level of consciousness: awake Pain management: pain level controlled Vital Signs Assessment: post-procedure vital signs reviewed and stable Respiratory status: spontaneous breathing Cardiovascular status: stable Postop Assessment: no apparent nausea or vomiting Anesthetic complications: no    Last Vitals:  Vitals:   12/30/18 1415 12/30/18 1430  BP: (!) 131/91 (!) 132/91  Pulse: (!) 101 92  Resp: 16 19  Temp:    SpO2: 100% 100%    Last Pain:  Vitals:   12/30/18 1201  TempSrc: Oral  PainSc: 0-No pain   Pain Goal: Patients Stated Pain Goal: 3 (12/30/18 1201)                 Huston Foley

## 2018-12-30 NOTE — Anesthesia Procedure Notes (Signed)
Procedure Name: LMA Insertion Date/Time: 12/30/2018 1:25 PM Performed by: Hewitt Blade, CRNA Pre-anesthesia Checklist: Patient identified Patient Re-evaluated:Patient Re-evaluated prior to induction Oxygen Delivery Method: Circle system utilized Preoxygenation: Pre-oxygenation with 100% oxygen Induction Type: IV induction LMA: LMA inserted LMA Size: 3.0 Number of attempts: 1 Dental Injury: Teeth and Oropharynx as per pre-operative assessment  Comments: Preformed by S. Jimmye Norman, [place]

## 2018-12-30 NOTE — Transfer of Care (Signed)
Immediate Anesthesia Transfer of Care Note  Patient: [name]  Procedure(s) Performed: DILATATION & CURETTAGE/HYSTEROSCOPY WITH MYOSURE (N/A ) HYSTEROSCOPY WITH NOVASURE, POSSIBLE HYDROTHERMAL ABLATION (N/A )  Patient Location: PACU  Anesthesia Type:General  Level of Consciousness: drowsy  Airway & Oxygen Therapy: Patient Spontanous Breathing and Patient connected to face mask oxygen  Post-op Assessment: Report given to RN and Post -op Vital signs reviewed and stable  Post vital signs: Reviewed and stable  Last Vitals:  Vitals Value Taken Time  BP 114/94 12/30/2018  2:03 PM  Temp    Pulse 109 12/30/2018  2:08 PM  Resp 18 12/30/2018  2:08 PM  SpO2 100 % 12/30/2018  2:08 PM  Vitals shown include unvalidated device data.  Last Pain:  Vitals:   12/30/18 1201  TempSrc: Oral  PainSc: 0-No pain      Patients Stated Pain Goal: 3 (60/15/61 5379)  Complications: No apparent anesthesia complications

## 2018-12-30 NOTE — Progress Notes (Signed)
Patient ID: [name], female   DOB: 10-15-1971, 48 y.o.   MRN: [id number] Patient seen and examined. Consent witnessed and signed. No changes noted. Update completed. BP 126/85   Pulse 84   Temp 97.8 F (36.6 C) (Oral)   Resp 16   Ht 5' 7"  (1.702 m)   Wt 65.8 kg   SpO2 100%   BMI 22.71 kg/m   CBC    Component Value Date/Time   WBC 5.4 12/30/2018 1200   RBC 4.69 12/30/2018 1200   HGB 10.8 (L) 12/30/2018 1200   HCT 35.5 (L) 12/30/2018 1200   PLT 387 12/30/2018 1200   MCV 75.7 (L) 12/30/2018 1200   MCH 23.0 (L) 12/30/2018 1200   MCHC 30.4 12/30/2018 1200   RDW 17.2 (H) 12/30/2018 1200   LYMPHSABS 1.2 03/06/2017 2258   MONOABS 0.2 03/06/2017 2258   EOSABS 0.0 03/06/2017 2258   BASOSABS 0.0 03/06/2017 2258

## 2018-12-30 NOTE — Op Note (Signed)
12/30/2018  1:53 PM  PATIENT:  [name]  48 y.o. female  PRE-OPERATIVE DIAGNOSIS:  Menorrhagia, Anemia  POST-OPERATIVE DIAGNOSIS:  Refractory menorrhagia Anemia Endometrial polyps  PROCEDURE:  Procedure(s): DILATATION & CURETTAGE/ HYSTEROSCOPY WITH MYOSURE HYSTEROSCOPY WITH NOVASURE,  ENDOMETRIAL POLYPECTOMY  SURGEON:  Surgeon(s): Brien Few, MD  ASSISTANTS: none   ANESTHESIA:   local and general  ESTIMATED BLOOD LOSS: 20 mL   DRAINS: none   LOCAL MEDICATIONS USED:  MARCAINE    and Amount: 20 ml  SPECIMEN:  Source of Specimen:  EMC AND ENDOMETRIAL POLYPS  DISPOSITION OF SPECIMEN:  PATHOLOGY  COUNTS:  YES  DICTATION #: [id number]  PLAN OF CARE: DC HOME  PATIENT DISPOSITION:  PACU - hemodynamically stable.

## 2018-12-31 ENCOUNTER — Encounter (HOSPITAL_COMMUNITY): Payer: Self-pay | Admitting: Obstetrics and Gynecology

## 2018-12-31 NOTE — Op Note (Signed)
NAMERYLEAH, MIRAMONTES MEDICAL RECORD [id number] ACCOUNT [id number] DATE OF BIRTH:02-Jan-1971 FACILITY: [facility] LOCATION: [facility] PHYSICIAN:Kaceton Vieau J. Ronita Hipps, MD  OPERATIVE REPORT  DATE OF PROCEDURE:  12/30/2018  PREOPERATIVE DIAGNOSES:  Menometrorrhagia with secondary anemia, uterine fibroids.  POSTOPERATIVE DIAGNOSES:   Menometrorrhagia with secondary anemia, uterine fibroids, endometrial polyps x3.  PROCEDURE:  Diagnostic hysteroscopy, dilatation and curettage, MyoSure resection of multiple endometrial polyps, NovaSure endometrial ablation.  SURGEON:  Brien Few, MD  ASSISTANT:  None.  ANESTHESIA:  Local with general.  ESTIMATED BLOOD LOSS:  20 mL.  FLUID DEFICIT:  230 mL.  COMPLICATIONS:  None.  DRAINS:  None.  COUNTS:  Correct.  DISPOSITION:  The patient to recovery in good condition.  SPECIMENS:  Endometrial curettings and polypoid fragments to pathology.  BRIEF OPERATIVE NOTE:  After being apprised of the risks of anesthesia, infection, bleeding, injury to the surrounding organs, possible need for repair, delayed versus immediate complications to include bowel and bladder injury, possible need for repair,  the patient was brought to the operating room and administered a general anesthetic without complications.  Prepped and draped in the usual sterile fashion.  Catheterized until the bladder was empty.  Exam under anesthesia revealing a bulky fixed  retroflexed uterus.  No adnexal masses.  At this time, the dilute Pitressin solution placed 3 and 9 o'clock.  Dilute Marcaine solution placed.  Standard paracervical block 20 mL total.  Cervix dilated to 21 Pratt dilator.  Hysteroscope placed.   Visualization reveals a lower uterine segment polyp, a right lateral wall polyp, and a left fundal polyp.  NovaSure device was entered and all resected without difficulty.  D and C performed using sharp curettage in a 4-quadrant method.  No endometrial  defects were seen  subsequent.  No evidence of submucous fibroids.  NovaSure device was placed, seated to a length of 6.5 and a width of 4.8, initiated after negative CO2 test for 43 seconds.  At the end of the procedure, the device was inspected and  found to be intact.  The cavity was inspected.  No evidence of perforation noted.  Due to the enlarged nature of the cavity, there was a stripe of tissue at the fundus that appeared to be less than fully ablated.  At this time, procedure was terminated.   The patient was awakened and transferred to recovery in good condition.  LN/NUANCE  D:12/30/2018 T:12/31/2018 JOB:[id number]

## 2019-01-13 ENCOUNTER — Encounter: Payer: Self-pay | Admitting: Family Medicine

## 2019-01-13 ENCOUNTER — Ambulatory Visit: Payer: 59 | Admitting: Family Medicine

## 2019-01-13 VITALS — BP 122/68 | HR 97 | Temp 98.4°F | Ht 67.0 in | Wt 141.0 lb

## 2019-01-13 DIAGNOSIS — D508 Other iron deficiency anemias: Secondary | ICD-10-CM

## 2019-01-13 DIAGNOSIS — N920 Excessive and frequent menstruation with regular cycle: Secondary | ICD-10-CM | POA: Diagnosis not present

## 2019-01-13 LAB — CBC
HCT: 33.6 % — ABNORMAL LOW (ref 36.0–46.0)
Hemoglobin: 10.6 g/dL — ABNORMAL LOW (ref 12.0–15.0)
MCHC: 31.5 g/dL (ref 30.0–36.0)
MCV: 72.9 fl — ABNORMAL LOW (ref 78.0–100.0)
Platelets: 338 10*3/uL (ref 150.0–400.0)
RBC: 4.62 Mil/uL (ref 3.87–5.11)
RDW: 19.1 % — ABNORMAL HIGH (ref 11.5–15.5)
WBC: 5.6 10*3/uL (ref 4.0–10.5)

## 2019-01-13 LAB — B12 AND FOLATE PANEL
Folate: 8.8 ng/mL (ref >5.9)
Vitamin B-12: 354 pg/mL (ref 211–911)

## 2019-01-13 MED ORDER — FUSION PLUS PO CAPS: 1.0000 | ORAL_CAPSULE | Freq: Every day | ORAL | 3 refills | Status: DC

## 2019-01-13 NOTE — Progress Notes (Signed)
[name] - 48 y.o. female MRN [id number]  Date of birth: Nov 28, 1971  Subjective Chief Complaint  Patient presents with  . Leg Pain    resteless leg in left leg occurs only at night-denies injury or trauma    HPI Sabrina GRACIEANN Shields is a 48 y.o. female with history of iron deficiency anemia here today with complaint of sensation of restless legs.  Symptoms started about 2-3 weeks ago shortly after [facility] and endometrial ablation.  Hgb on 12/30/18 was 10.8 with elevated RDW and low MCV consistent with iron deficiency. She has not been taking iron supplement regularly as ferrous sulfate tends to constipate her.  She has tried a sample of fusion plus iron supplement which seems to work better for her.  She denies leg weakness, numbness or tingling associated with symptoms.   ROS:  A comprehensive ROS was completed and negative except as noted per HPI  No Known Allergies  Past Medical History:  Diagnosis Date  . Abnormal Pap smear   . Anemia   . Endometriosis   . Fibroids   . Ovarian cyst     Past Surgical History:  Procedure Laterality Date  .  diagnostic laparscopic  06/2004  . CHROMOPERTUBATION N/A 11/28/2013   Procedure: CHROMOPERTUBATION, ;  Surgeon: Lovenia Kim, MD;  Location: [facility];  Service: Gynecology;  Laterality: N/A;  . DIAGNOSTIC LAPAROSCOPY  2005   [place]  . DILATATION & CURETTAGE/HYSTEROSCOPY WITH MYOSURE N/A 12/30/2018   Procedure: DILATATION & CURETTAGE/HYSTEROSCOPY WITH MYOSURE;  Surgeon: Brien Few, MD;  Location: [facility];  Service: Gynecology;  Laterality: N/A;  . HYSTEROSCOPY WITH NOVASURE N/A 12/30/2018   Procedure: HYSTEROSCOPY WITH NOVASURE, POSSIBLE HYDROTHERMAL ABLATION;  Surgeon: Brien Few, MD;  Location: [facility];  Service: Gynecology;  Laterality: N/A;  . OVARIAN CYST REMOVAL  2015  . ROBOTIC ASSISTED LAPAROSCOPIC LYSIS OF ADHESION N/A 11/28/2013   Procedure: ROBOTIC ASSISTED LAPAROSCOPIC LYSIS OF ADHESION; EXCISION OF RIGHT OVARIAN  CYST WALL AND MYOMECTOMY;  Surgeon: Lovenia Kim, MD;  Location: [facility];  Service: Gynecology;  Laterality: N/A;  . UTERINE FIBROID SURGERY  2015  . WISDOM TOOTH EXTRACTION      Social History   Socioeconomic History  . Marital status: Married    Spouse name: Not on file  . Number of children: Not on file  . Years of education: Not on file  . Highest education level: Not on file  Occupational History  . Not on file  Social Needs  . Financial resource strain: Not on file  . Food insecurity:    Worry: Not on file    Inability: Not on file  . Transportation needs:    Medical: Not on file    Non-medical: Not on file  Tobacco Use  . Smoking status: Never Smoker  . Smokeless tobacco: Never Used  Substance and Sexual Activity  . Alcohol use: No  . Drug use: No  . Sexual activity: Yes    Birth control/protection: None  Lifestyle  . Physical activity:    Days per week: Not on file    Minutes per session: Not on file  . Stress: Not on file  Relationships  . Social connections:    Talks on phone: Not on file    Gets together: Not on file    Attends religious service: Not on file    Active member of club or organization: Not on file    Attends meetings of clubs or organizations: Not on file  Relationship status: Not on file  Other Topics Concern  . Not on file  Social History Narrative  . Not on file    Family History  Problem Relation Age of Onset  . Hypertension Mother   . Stroke Mother   . Hypertension Father   . Heart disease Father   . Stroke Maternal Grandfather   . Hypertension Maternal Grandfather   . Cancer Paternal Grandmother        stomach cancer  . Hyperlipidemia Paternal Grandmother     Health Maintenance  Topic Date Due  . PAP SMEAR-Modifier  04/06/1992  . INFLUENZA VACCINE  06/30/2018  . HIV Screening  05/14/2019 (Originally 04/06/1986)  . TETANUS/TDAP  06/30/2026     ----------------------------------------------------------------------------------------------------------------------------------------------------------------------------------------------------------------- Physical Exam BP 122/68   Pulse 97   Temp 98.4 F (36.9 C) (Oral)   Ht 5' 7"  (1.702 m)   Wt 141 lb (64 kg)   LMP 05/13/2018   SpO2 99%   BMI 22.08 kg/m   Physical Exam Constitutional:      Appearance: Normal appearance. She is not ill-appearing.  HENT:     Head: Normocephalic and atraumatic.     Mouth/Throat:     Mouth: Mucous membranes are moist.  Eyes:     General: No scleral icterus. Neck:     Musculoskeletal: Neck supple.  Cardiovascular:     Rate and Rhythm: Normal rate and regular rhythm.  Pulmonary:     Effort: Pulmonary effort is normal.     Breath sounds: Normal breath sounds.  Skin:    General: Skin is warm and dry.     Findings: No rash.  Neurological:     General: No focal deficit present.     Mental Status: She is alert.     Gait: Gait normal.     Deep Tendon Reflexes: Reflexes normal.  Psychiatric:        Mood and Affect: Mood normal.        Behavior: Behavior normal.     ------------------------------------------------------------------------------------------------------------------------------------------------------------------------------------------------------------------- Assessment and Plan  Iron deficiency anemia -Discussed with her that RLS symptoms likely from iron deficiency -Updated CBC and iron panel today. -Rx for fusion plus as she has tolerated this much better than traditional ferrous sulfate.  -F/u 8-10 weeks.

## 2019-01-13 NOTE — Patient Instructions (Signed)

## 2019-01-13 NOTE — Assessment & Plan Note (Signed)
-  Discussed with her that RLS symptoms likely from iron deficiency -Updated CBC and iron panel today. -Rx for fusion plus as she has tolerated this much better than traditional ferrous sulfate.  -F/u 8-10 weeks.

## 2019-01-14 LAB — IRON,TIBC AND FERRITIN PANEL
%SAT: 5 % (calc) — ABNORMAL LOW (ref 16–45)
Ferritin: 10 ng/mL — ABNORMAL LOW (ref 16–232)
Iron: 21 ug/dL — ABNORMAL LOW (ref 40–190)
TIBC: 387 mcg/dL (calc) (ref 250–450)

## 2019-01-16 ENCOUNTER — Telehealth: Payer: Self-pay | Admitting: Behavioral Health

## 2019-01-16 DIAGNOSIS — D508 Other iron deficiency anemias: Secondary | ICD-10-CM

## 2019-01-16 NOTE — Telephone Encounter (Signed)
Notes recorded by Luetta Nutting, DO on 01/16/2019 at 10:38 AM EST Iron and hgb levels are low as expected. She should take iron supplement daily. I think the sensation in her legs will improve as her iron levels improve. Lets recheck in 12 weeks.    Patient was made aware of the above lab results & provider's recommendations. Lab appointment was scheduled for 04/14/2019 at 1:00 PM. Future lab orders have been placed.

## 2019-01-16 NOTE — Progress Notes (Signed)
Iron and hgb levels are low as expected.  She should take iron supplement daily.  I think the sensation in her legs will improve as her iron levels improve.  Lets recheck in 12 weeks.

## 2019-04-14 ENCOUNTER — Other Ambulatory Visit: Payer: 59

## 2019-04-14 NOTE — Addendum Note (Signed)
Addended by: Lynnea Ferrier on: 04/14/2019 07:35 AM   Modules accepted: Orders

## 2019-04-28 MED FILL — medroxyPROGESTERone ACETATE: 150 | 90 days supply | Qty: 1 | Fill #0

## 2019-07-18 ENCOUNTER — Telehealth: Payer: Self-pay

## 2019-07-18 NOTE — Telephone Encounter (Signed)
Questions for Screening COVID-19  Symptom onset: n/a  Travel or Contacts: no  During this illness, did/does the patient experience any of the following symptoms? Fever >100.14F []   Yes [x]   No []   Unknown Subjective fever (felt feverish) []   Yes [x]   No []   Unknown Chills []   Yes [x]   No []   Unknown Muscle aches (myalgia) []   Yes [x]   No []   Unknown Runny nose (rhinorrhea) []   Yes [x]   No []   Unknown Sore throat []   Yes [x]   No []   Unknown Cough (new onset or worsening of chronic cough) []   Yes [x]   No []   Unknown Shortness of breath (dyspnea) []   Yes [x]   No []   Unknown Nausea or vomiting []   Yes [x]   No []   Unknown Headache []   Yes [x]   No []   Unknown Abdominal pain  []   Yes [x]   No []   Unknown Diarrhea (?3 loose/looser than normal stools/24hr period) []   Yes [x]   No []   Unknown Other, specify:  Patient risk factors: Smoker? []   Current []   Former []   Never If female, currently pregnant? []   Yes []   No  Patient Active Problem List   Diagnosis Date Noted  . Well adult exam 05/13/2018  . Iron deficiency anemia 04/03/2016    Plan:  []   High risk for COVID-19 with red flags go to ED (with CP, SOB, weak/lightheaded, or fever > 101.5). Call ahead.  []   High risk for COVID-19 but stable. Inform provider and coordinate time for [facility] visit.   []   No red flags but URI signs or symptoms okay for [facility] visit.

## 2019-07-19 ENCOUNTER — Other Ambulatory Visit (INDEPENDENT_AMBULATORY_CARE_PROVIDER_SITE_OTHER): Payer: 59

## 2019-07-19 ENCOUNTER — Other Ambulatory Visit: Payer: 59

## 2019-07-19 ENCOUNTER — Ambulatory Visit (INDEPENDENT_AMBULATORY_CARE_PROVIDER_SITE_OTHER): Payer: 59 | Admitting: Family Medicine

## 2019-07-19 ENCOUNTER — Encounter: Payer: Self-pay | Admitting: Family Medicine

## 2019-07-19 VITALS — BP 126/82 | HR 84 | Temp 97.1°F | Ht 67.0 in | Wt 147.8 lb

## 2019-07-19 DIAGNOSIS — D509 Iron deficiency anemia, unspecified: Secondary | ICD-10-CM

## 2019-07-19 DIAGNOSIS — Z13220 Encounter for screening for lipoid disorders: Secondary | ICD-10-CM

## 2019-07-19 DIAGNOSIS — Z0000 Encounter for general adult medical examination without abnormal findings: Secondary | ICD-10-CM

## 2019-07-19 LAB — COMPREHENSIVE METABOLIC PANEL
ALT: 30 U/L (ref 0–35)
AST: 26 U/L (ref 0–37)
Albumin: 4.2 g/dL (ref 3.5–5.2)
Alkaline Phosphatase: 75 U/L (ref 39–117)
BUN: 9 mg/dL (ref 6–23)
CO2: 22 mEq/L (ref 19–32)
Calcium: 9.4 mg/dL (ref 8.4–10.5)
Chloride: 107 mEq/L (ref 96–112)
Creatinine, Ser: 0.69 mg/dL (ref 0.40–1.20)
GFR: 109.75 mL/min (ref >60.00)
Glucose, Bld: 80 mg/dL (ref 70–99)
Potassium: 3.6 mEq/L (ref 3.5–5.1)
Sodium: 140 mEq/L (ref 135–145)
Total Bilirubin: 0.6 mg/dL (ref 0.2–1.2)
Total Protein: 7.3 g/dL (ref 6.0–8.3)

## 2019-07-19 LAB — POCT URINALYSIS DIPSTICK
Bilirubin, UA: NEGATIVE
Glucose, UA: NEGATIVE
Ketones, UA: NEGATIVE
Nitrite, UA: NEGATIVE
Protein, UA: POSITIVE — AB
Spec Grav, UA: 1.025 (ref 1.010–1.025)
Urobilinogen, UA: 1 E.U./dL
pH, UA: 5.5 (ref 5.0–8.0)

## 2019-07-19 LAB — CBC WITH DIFFERENTIAL/PLATELET
Basophils Absolute: 0.1 10*3/uL (ref 0.0–0.1)
Basophils Relative: 1.1 % (ref 0.0–3.0)
Eosinophils Absolute: 0.1 10*3/uL (ref 0.0–0.7)
Eosinophils Relative: 2 % (ref 0.0–5.0)
HCT: 41.8 % (ref 36.0–46.0)
Hemoglobin: 13.7 g/dL (ref 12.0–15.0)
Lymphocytes Relative: 29.2 % (ref 12.0–46.0)
Lymphs Abs: 1.6 10*3/uL (ref 0.7–4.0)
MCHC: 32.8 g/dL (ref 30.0–36.0)
MCV: 88.2 fl (ref 78.0–100.0)
Monocytes Absolute: 0.3 10*3/uL (ref 0.1–1.0)
Monocytes Relative: 5 % (ref 3.0–12.0)
Neutro Abs: 3.5 10*3/uL (ref 1.4–7.7)
Neutrophils Relative %: 62.7 % (ref 43.0–77.0)
Platelets: 229 10*3/uL (ref 150.0–400.0)
RBC: 4.73 Mil/uL (ref 3.87–5.11)
RDW: 15.5 % (ref 11.5–15.5)
WBC: 5.6 10*3/uL (ref 4.0–10.5)

## 2019-07-19 LAB — LIPID PANEL
Cholesterol: 177 mg/dL (ref 0–200)
HDL: 49.4 mg/dL (ref >39.00)
LDL Cholesterol: 118 mg/dL — ABNORMAL HIGH (ref 0–99)
NonHDL: 127.83
Total CHOL/HDL Ratio: 4
Triglycerides: 51 mg/dL (ref 0.0–149.0)
VLDL: 10.2 mg/dL (ref 0.0–40.0)

## 2019-07-19 LAB — TSH: TSH: 1.63 u[IU]/mL (ref 0.35–4.50)

## 2019-07-19 NOTE — Assessment & Plan Note (Signed)
Check CBC and iron panel today.

## 2019-07-19 NOTE — Assessment & Plan Note (Signed)
Well adult Orders Placed This Encounter  Procedures  . Comp Met (CMET)  . CBC w/Diff  . Lipid panel  . TSH  . Iron, TIBC and Ferritin Panel  . POCT Urinalysis Dipstick  Screening: Lipid Immunizations: UTD Anticipatory guidance/risk factor reduction:  Continue healthy diet.  Recommend adding daily exercise.  Additional recommendations per AVS 

## 2019-07-19 NOTE — Patient Instructions (Signed)

## 2019-07-19 NOTE — Progress Notes (Signed)
[name] - 48 y.o. female MRN [id number]  Date of birth: 06-20-1971  Subjective Chief Complaint  Patient presents with  . Annual Exam    CPE- fasting/ last pap oct 2019 has OB/ denies flu/ wants to get tested for UTI having lower back pain 7 days    HPI [name] is a 48 y.o. female with history of iron deficiency anemia 2/2 to menorrhagia. Had ablation in 10/2018 but continues to have almost daily bleeding.  She has taken iron supplement intermittently.  She has had some back pain for the past week.  Wants to be checked for UTI.  Denies dysuria or frequency.  She follows a balanced diet.  She does not exercise regularly.  She is a non-smoker.    Review of Systems  Constitutional: Negative for chills, fever, malaise/fatigue and weight loss.  HENT: Negative for congestion, ear pain and sore throat.   Eyes: Negative for blurred vision, double vision and pain.  Respiratory: Negative for cough and shortness of breath.   Cardiovascular: Negative for chest pain and palpitations.  Gastrointestinal: Negative for abdominal pain, blood in stool, constipation, heartburn and nausea.  Genitourinary: Negative for dysuria and urgency.  Musculoskeletal: Positive for back pain. Negative for joint pain and myalgias.  Neurological: Negative for dizziness and headaches.  Endo/Heme/Allergies: Does not bruise/bleed easily.  Psychiatric/Behavioral: Negative for depression. The patient is not nervous/anxious and does not have insomnia.     No Known Allergies  Past Medical History:  Diagnosis Date  . Abnormal Pap smear   . Anemia   . Endometriosis   . Fibroids   . Ovarian cyst     Past Surgical History:  Procedure Laterality Date  .  diagnostic laparscopic  06/2004  . CHROMOPERTUBATION N/A 11/28/2013   Procedure: CHROMOPERTUBATION, ;  Surgeon: Lovenia Kim, MD;  Location: [facility];  Service: Gynecology;  Laterality: N/A;  . DIAGNOSTIC LAPAROSCOPY  2005   [place]  .  DILATATION & CURETTAGE/HYSTEROSCOPY WITH MYOSURE N/A 12/30/2018   Procedure: DILATATION & CURETTAGE/HYSTEROSCOPY WITH MYOSURE;  Surgeon: Brien Few, MD;  Location: [facility];  Service: Gynecology;  Laterality: N/A;  . HYSTEROSCOPY WITH NOVASURE N/A 12/30/2018   Procedure: HYSTEROSCOPY WITH NOVASURE, POSSIBLE HYDROTHERMAL ABLATION;  Surgeon: Brien Few, MD;  Location: [facility];  Service: Gynecology;  Laterality: N/A;  . OVARIAN CYST REMOVAL  2015  . ROBOTIC ASSISTED LAPAROSCOPIC LYSIS OF ADHESION N/A 11/28/2013   Procedure: ROBOTIC ASSISTED LAPAROSCOPIC LYSIS OF ADHESION; EXCISION OF RIGHT OVARIAN CYST WALL AND MYOMECTOMY;  Surgeon: Lovenia Kim, MD;  Location: [facility];  Service: Gynecology;  Laterality: N/A;  . UTERINE FIBROID SURGERY  2015  . WISDOM TOOTH EXTRACTION      Social History   Socioeconomic History  . Marital status: Married    Spouse name: Not on file  . Number of children: Not on file  . Years of education: Not on file  . Highest education level: Not on file  Occupational History  . Not on file  Social Needs  . Financial resource strain: Not on file  . Food insecurity    Worry: Not on file    Inability: Not on file  . Transportation needs    Medical: Not on file    Non-medical: Not on file  Tobacco Use  . Smoking status: Never Smoker  . Smokeless tobacco: Never Used  Substance and Sexual Activity  . Alcohol use: No  . Drug use: No  . Sexual activity: Yes  Birth control/protection: None  Lifestyle  . Physical activity    Days per week: Not on file    Minutes per session: Not on file  . Stress: Not on file  Relationships  . Social Herbalist on phone: Not on file    Gets together: Not on file    Attends religious service: Not on file    Active member of club or organization: Not on file    Attends meetings of clubs or organizations: Not on file    Relationship status: Not on file  Other Topics Concern  . Not on file  Social History  Narrative  . Not on file    Family History  Problem Relation Age of Onset  . Hypertension Mother   . Stroke Mother   . Hypertension Father   . Heart disease Father   . Stroke Maternal Grandfather   . Hypertension Maternal Grandfather   . Cancer Paternal Grandmother        stomach cancer  . Hyperlipidemia Paternal Grandmother     Health Maintenance  Topic Date Due  . HIV Screening  04/06/1986  . INFLUENZA VACCINE  07/01/2019  . PAP SMEAR-Modifier  09/12/2021  . TETANUS/TDAP  06/30/2026    ----------------------------------------------------------------------------------------------------------------------------------------------------------------------------------------------------------------- Physical Exam BP 126/82   Pulse 84   Temp (!) 97.1 F (36.2 C) (Temporal)   Ht 5' 7"  (1.702 m)   Wt 147 lb 12.8 oz (67 kg)   LMP 05/13/2018   SpO2 99%   BMI 23.15 kg/m   Physical Exam Constitutional:      General: She is not in acute distress. HENT:     Head: Normocephalic and atraumatic.     Right Ear: Tympanic membrane normal.     Left Ear: Tympanic membrane normal.     Nose: Nose normal.     Mouth/Throat:     Mouth: Mucous membranes are moist.  Eyes:     General: No scleral icterus.    Conjunctiva/sclera: Conjunctivae normal.  Neck:     Musculoskeletal: Normal range of motion and neck supple.     Thyroid: No thyromegaly.  Cardiovascular:     Rate and Rhythm: Normal rate and regular rhythm.     Heart sounds: Normal heart sounds.  Pulmonary:     Effort: Pulmonary effort is normal.     Breath sounds: Normal breath sounds.  Abdominal:     General: Bowel sounds are normal. There is no distension.     Palpations: Abdomen is soft.     Tenderness: There is no abdominal tenderness. There is no guarding.  Musculoskeletal: Normal range of motion.  Lymphadenopathy:     Cervical: No cervical adenopathy.  Skin:    General: Skin is warm and dry.     Findings: No rash.   Neurological:     Mental Status: She is alert and oriented to person, place, and time.     Cranial Nerves: No cranial nerve deficit.     Coordination: Coordination normal.  Psychiatric:        Mood and Affect: Mood normal.        Behavior: Behavior normal.     ------------------------------------------------------------------------------------------------------------------------------------------------------------------------------------------------------------------- Assessment and Plan  Well adult exam Well adult Orders Placed This Encounter  Procedures  . Comp Met (CMET)  . CBC w/Diff  . Lipid panel  . TSH  . Iron, TIBC and Ferritin Panel  . POCT Urinalysis Dipstick  Screening: Lipid Immunizations: UTD Anticipatory guidance/risk factor reduction:  Continue healthy diet.  Recommend adding daily exercise.  Additional recommendations per AVS  Iron deficiency anemia Check CBC and iron panel today.

## 2019-07-20 LAB — IRON,TIBC AND FERRITIN PANEL
%SAT: 26 % (calc) (ref 16–45)
Ferritin: 13 ng/mL — ABNORMAL LOW (ref 16–232)
Iron: 105 ug/dL (ref 40–190)
TIBC: 411 mcg/dL (calc) (ref 250–450)

## 2019-08-18 ENCOUNTER — Other Ambulatory Visit: Payer: Self-pay

## 2019-08-18 DIAGNOSIS — R6889 Other general symptoms and signs: Secondary | ICD-10-CM | POA: Diagnosis not present

## 2019-08-18 DIAGNOSIS — Z20822 Contact with and (suspected) exposure to covid-19: Secondary | ICD-10-CM

## 2019-08-19 LAB — NOVEL CORONAVIRUS, NAA: SARS-CoV-2, NAA: NOT DETECTED

## 2019-08-29 ENCOUNTER — Telehealth: Payer: Self-pay

## 2019-08-29 NOTE — Telephone Encounter (Signed)
Please have her schedule with me to check BP in office.

## 2019-08-29 NOTE — Telephone Encounter (Signed)
Copied from [place] [id number]. Topic: General - Other >> Aug 28, 2019  4:02 PM Leward Quan A wrote: Reason for CRM: Patient called to say that her BP have been elevated but she does not have high BP and that she is having constant headaches. Per patient she last checked BP on 08/25/2019 and it was 137/100. She is askinng Dr Zigmund Daniel what should she do. Please call patient at Ph# [phone]

## 2019-08-29 NOTE — Telephone Encounter (Signed)
Pt has appointment for 10/1.

## 2019-08-29 NOTE — Telephone Encounter (Signed)
I left pt a voicemail to call the office back to schedule an appointment with Dr. Zigmund Daniel.

## 2019-08-31 ENCOUNTER — Other Ambulatory Visit: Payer: Self-pay

## 2019-08-31 ENCOUNTER — Telehealth (INDEPENDENT_AMBULATORY_CARE_PROVIDER_SITE_OTHER): Payer: 59 | Admitting: Family Medicine

## 2019-08-31 ENCOUNTER — Encounter: Payer: Self-pay | Admitting: Family Medicine

## 2019-08-31 DIAGNOSIS — I10 Essential (primary) hypertension: Secondary | ICD-10-CM | POA: Insufficient documentation

## 2019-08-31 MED ORDER — AMLODIPINE BESYLATE 5 MG PO TABS: 5.0000 mg | ORAL_TABLET | Freq: Every day | ORAL | 0 refills | Status: DC

## 2019-08-31 MED FILL — AMLODIPINE BESYLATE 5 MG TA: 5 | 90 days supply | Qty: 90 | Fill #0

## 2019-08-31 NOTE — Progress Notes (Signed)
[name] - 48 y.o. female MRN [id number]  Date of birth: 1971/01/11   This visit type was conducted due to national recommendations for restrictions regarding the COVID-19 Pandemic (e.g. social distancing).  This format is felt to be most appropriate for this patient at this time.  All issues noted in this document were discussed and addressed.  No physical exam was performed (except for noted visual exam findings with Video Visits).  I discussed the limitations of evaluation and management by telemedicine and the availability of in person appointments. The patient expressed understanding and agreed to proceed.  I connected with@ on 08/31/19 at 11:20 AM EDT by a video enabled telemedicine application and verified that I am speaking with the correct person using two identifiers.   Patient Location: Home [place] [address]   Provider location:   Home office  Chief Complaint  Patient presents with  . Follow-up    follow up high BP reading 144/100/headache--pt has been monitoring her BP in the past 3 wks    HPI  [name] is a 48 y.o. female who presents via audio/video conferencing for a telehealth visit today.  She has concerns about high blood pressure today.  She reports that she has checked her blood pressure several times over the past week with elevated readings.  Most recent reading of 144/100.  She has had headaches as well that she believes may be related to her blood pressure.  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  She also reports that she is not sleeping well due to vasomotor symptoms related to menopause.  She denies a high salt diet.  Other than headaches she denies chest pain, shortness of breath, vision changes, dizziness or palpitations.     ROS:  A comprehensive ROS was completed and negative except as noted per HPI  Past Medical History:  Diagnosis Date  . Abnormal Pap  smear   . Anemia   . Endometriosis   . Fibroids   . Ovarian cyst     Past Surgical History:  Procedure Laterality Date  .  diagnostic laparscopic  06/2004  . CHROMOPERTUBATION N/A 11/28/2013   Procedure: CHROMOPERTUBATION, ;  Surgeon: Lovenia Kim, MD;  Location: [facility];  Service: Gynecology;  Laterality: N/A;  . DIAGNOSTIC LAPAROSCOPY  2005   [place]  . DILATATION & CURETTAGE/HYSTEROSCOPY WITH MYOSURE N/A 12/30/2018   Procedure: DILATATION & CURETTAGE/HYSTEROSCOPY WITH MYOSURE;  Surgeon: Brien Few, MD;  Location: [facility];  Service: Gynecology;  Laterality: N/A;  . HYSTEROSCOPY WITH NOVASURE N/A 12/30/2018   Procedure: HYSTEROSCOPY WITH NOVASURE, POSSIBLE HYDROTHERMAL ABLATION;  Surgeon: Brien Few, MD;  Location: [facility];  Service: Gynecology;  Laterality: N/A;  . OVARIAN CYST REMOVAL  2015  . ROBOTIC ASSISTED LAPAROSCOPIC LYSIS OF ADHESION N/A 11/28/2013   Procedure: ROBOTIC ASSISTED LAPAROSCOPIC LYSIS OF ADHESION; EXCISION OF RIGHT OVARIAN CYST WALL AND MYOMECTOMY;  Surgeon: Lovenia Kim, MD;  Location: [facility];  Service: Gynecology;  Laterality: N/A;  . UTERINE FIBROID SURGERY  2015  . WISDOM TOOTH EXTRACTION      Family History  Problem Relation Age of Onset  . Hypertension Mother   . Stroke Mother   . Hypertension Father   . Heart disease Father   . Stroke Maternal Grandfather   . Hypertension Maternal Grandfather   . Cancer Paternal Grandmother        stomach cancer  . Hyperlipidemia Paternal  Grandmother     Social History   Socioeconomic History  . Marital status: Married    Spouse name: Not on file  . Number of children: Not on file  . Years of education: Not on file  . Highest education level: Not on file  Occupational History  . Not on file  Social Needs  . Financial resource strain: Not on file  . Food insecurity    Worry: Not on file    Inability: Not on file  . Transportation needs    Medical: Not on file    Non-medical: Not on  file  Tobacco Use  . Smoking status: Never Smoker  . Smokeless tobacco: Never Used  Substance and Sexual Activity  . Alcohol use: No  . Drug use: No  . Sexual activity: Yes    Birth control/protection: None  Lifestyle  . Physical activity    Days per week: Not on file    Minutes per session: Not on file  . Stress: Not on file  Relationships  . Social Herbalist on phone: Not on file    Gets together: Not on file    Attends religious service: Not on file    Active member of club or organization: Not on file    Attends meetings of clubs or organizations: Not on file    Relationship status: Not on file  . Intimate partner violence    Fear of current or ex partner: Not on file    Emotionally abused: Not on file    Physically abused: Not on file    Forced sexual activity: Not on file  Other Topics Concern  . Not on file  Social History Narrative  . Not on file     Current Outpatient Medications:  .  amLODipine (NORVASC) 5 MG tablet, Take 1 tablet (5 mg total) by mouth daily., Disp: 90 tablet, Rfl: 0 .  Ferrous Sulfate (IRON) 325 (65 Fe) MG TABS, Take by mouth., Disp: , Rfl:  .  Iron-FA-B Cmp-C-Biot-Probiotic (FUSION PLUS) CAPS, Take 1 tablet by mouth daily. (Patient not taking: Reported on 08/31/2019), Disp: 30 capsule, Rfl: 3 .  medroxyPROGESTERone (DEPO-PROVERA) 150 MG/ML injection, Inject 1 mL (150 mg total) into the muscle every 3 (three) months. (Patient not taking: Reported on 08/31/2019), Disp: 1 mL, Rfl: 4  EXAM:  VITALS per patient if applicable: BP (!) 295/18 Comment: pt report  Pulse 86 Comment: pt report  Temp (!) 97.5 F (36.4 C) (Oral) Comment: pt reprt  Ht 5' 7"  (1.702 m)   Wt 147 lb 6.4 oz (66.9 kg) Comment: pt reprt  LMP 05/13/2018   BMI 23.09 kg/m   GENERAL: alert, oriented, appears well and in no acute distress  HEENT: atraumatic, conjunttiva clear, no obvious abnormalities on inspection of external nose and ears  NECK: normal movements of  the head and neck  LUNGS: on inspection no signs of respiratory distress, breathing rate appears normal, no obvious gross SOB, gasping or wheezing  CV: no obvious cyanosis  MS: moves all visible extremities without noticeable abnormality  PSYCH/NEURO: pleasant and cooperative, no obvious depression or anxiety, speech and thought processing grossly intact  ASSESSMENT AND PLAN:  Discussed the following assessment and plan:  Essential hypertension -BP elevated on multiple readings 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  -Will start amlodipine 39m daily.  -Continue low salt diet -Focus on stress management.  -She will continue to monitor bp to assess response to  amlodipine.  -F/u with mein 3-4 weeks or sooner if needed.        I discussed the assessment and treatment plan with the patient. The patient was provided an opportunity to ask questions and all were answered. The patient agreed with the plan and demonstrated an understanding of the instructions.   The patient was advised to call back or seek an in-person evaluation if the symptoms worsen or if the condition fails to improve as anticipated.   Luetta Nutting, DO

## 2019-08-31 NOTE — Assessment & Plan Note (Addendum)
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  -Will start amlodipine 49m daily.  -Continue low salt diet -Focus on stress management.  -She will continue to monitor bp to assess response to amlodipine.  -F/u with mein 3-4 weeks or sooner if needed.

## 2019-09-01 ENCOUNTER — Ambulatory Visit: Payer: 59 | Admitting: Family Medicine

## 2019-09-01 DIAGNOSIS — Z1231 Encounter for screening mammogram for malignant neoplasm of breast: Secondary | ICD-10-CM | POA: Diagnosis not present

## 2019-09-01 DIAGNOSIS — Z01419 Encounter for gynecological examination (general) (routine) without abnormal findings: Secondary | ICD-10-CM | POA: Diagnosis not present

## 2019-09-01 DIAGNOSIS — N920 Excessive and frequent menstruation with regular cycle: Secondary | ICD-10-CM | POA: Diagnosis not present

## 2019-09-01 DIAGNOSIS — Z6823 Body mass index (BMI) 23.0-23.9, adult: Secondary | ICD-10-CM | POA: Diagnosis not present

## 2019-09-01 DIAGNOSIS — Z130 Encounter for screening for diseases of the blood and blood-forming organs and certain disorders involving the immune mechanism: Secondary | ICD-10-CM | POA: Diagnosis not present

## 2019-11-03 DIAGNOSIS — N920 Excessive and frequent menstruation with regular cycle: Secondary | ICD-10-CM | POA: Diagnosis not present

## 2019-11-30 ENCOUNTER — Other Ambulatory Visit: Payer: Self-pay

## 2019-11-30 ENCOUNTER — Other Ambulatory Visit: Payer: Self-pay | Admitting: Family Medicine

## 2019-11-30 DIAGNOSIS — I10 Essential (primary) hypertension: Secondary | ICD-10-CM

## 2019-11-30 MED FILL — AMLODIPINE BESYLATE 5 MG TA: 5 | 90 days supply | Qty: 90 | Fill #0

## 2019-12-05 ENCOUNTER — Telehealth (INDEPENDENT_AMBULATORY_CARE_PROVIDER_SITE_OTHER): Payer: 59 | Admitting: Family Medicine

## 2019-12-05 ENCOUNTER — Encounter: Payer: Self-pay | Admitting: Family Medicine

## 2019-12-05 DIAGNOSIS — I10 Essential (primary) hypertension: Secondary | ICD-10-CM

## 2019-12-05 MED ORDER — AMLODIPINE BESYLATE 5 MG PO TABS: 5.0000 mg | ORAL_TABLET | Freq: Every day | ORAL | 1 refills | Status: DC

## 2019-12-05 NOTE — Assessment & Plan Note (Signed)
BP is much better controlled at this time, continue amlodipine at current dose.  Recommend low salt diet.  F/u in 6 months.

## 2019-12-05 NOTE — Progress Notes (Signed)
[name] - 49 y.o. female MRN [id number]  Date of birth: 1971/08/05   This visit type was conducted due to national recommendations for restrictions regarding the COVID-19 Pandemic (e.g. social distancing).  This format is felt to be most appropriate for this patient at this time.  All issues noted in this document were discussed and addressed.  No physical exam was performed (except for noted visual exam findings with Video Visits).  I discussed the limitations of evaluation and management by telemedicine and the availability of in person appointments. The patient expressed understanding and agreed to proceed.  I connected with@ on 12/05/19 at 11:20 AM EST by a video enabled telemedicine application and verified that I am speaking with the correct person using two identifiers.  Present at visit: Sabrina Nutting, DO [place]   Patient Location: Home [place] [address]   Provider location:   [place]  Chief Complaint  Patient presents with  . Follow-up    Needs a refill  . Hypertension    HPI  [name] is a 49 y.o. female who presents via audio/video conferencing for a telehealth visit today.  She is following up today for HTN.  She was started on amlodipine at previous visit a couple of months ago.  She reports that she is doing well.  Her BP has been much better controlled since starting medication.  She denies symptoms of hypotension including dizziness/lightheadedness or fatigue.  She has noticed some changes to her hair that she thinks may be related to medication.     ROS:  A comprehensive ROS was completed and negative except as noted per HPI  Past Medical History:  Diagnosis Date  . Abnormal Pap smear   . Anemia   . Endometriosis   . Fibroids   . Ovarian cyst     Past Surgical History:  Procedure Laterality Date  .  diagnostic laparscopic  06/2004  . CHROMOPERTUBATION N/A 11/28/2013   Procedure: CHROMOPERTUBATION, ;  Surgeon: Lovenia Kim, MD;  Location: [facility];  Service: Gynecology;  Laterality: N/A;  . DIAGNOSTIC LAPAROSCOPY  2005   [place]  . DILATATION & CURETTAGE/HYSTEROSCOPY WITH MYOSURE N/A 12/30/2018   Procedure: DILATATION & CURETTAGE/HYSTEROSCOPY WITH MYOSURE;  Surgeon: Brien Few, MD;  Location: [facility];  Service: Gynecology;  Laterality: N/A;  . HYSTEROSCOPY WITH NOVASURE N/A 12/30/2018   Procedure: HYSTEROSCOPY WITH NOVASURE, POSSIBLE HYDROTHERMAL ABLATION;  Surgeon: Brien Few, MD;  Location: [facility];  Service: Gynecology;  Laterality: N/A;  . OVARIAN CYST REMOVAL  2015  . ROBOTIC ASSISTED LAPAROSCOPIC LYSIS OF ADHESION N/A 11/28/2013   Procedure: ROBOTIC ASSISTED LAPAROSCOPIC LYSIS OF ADHESION; EXCISION OF RIGHT OVARIAN CYST WALL AND MYOMECTOMY;  Surgeon: Lovenia Kim, MD;  Location: [facility];  Service: Gynecology;  Laterality: N/A;  . UTERINE FIBROID SURGERY  2015  . WISDOM TOOTH EXTRACTION      Family History  Problem Relation Age of Onset  . Hypertension Mother   . Stroke Mother   . Hypertension Father   . Heart disease Father   . Stroke Maternal Grandfather   . Hypertension Maternal Grandfather   . Cancer Paternal Grandmother        stomach cancer  . Hyperlipidemia Paternal Grandmother     Social History   Socioeconomic History  . Marital status: Married    Spouse name: Not on file  . Number of children: Not on file  . Years of education: Not  on file  . Highest education level: Not on file  Occupational History  . Not on file  Tobacco Use  . Smoking status: Never Smoker  . Smokeless tobacco: Never Used  Substance and Sexual Activity  . Alcohol use: No  . Drug use: No  . Sexual activity: Yes    Birth control/protection: None  Other Topics Concern  . Not on file  Social History Narrative  . Not on file   Social Determinants of Health   Financial Resource Strain:   . Difficulty of Paying Living Expenses: Not on  file  Food Insecurity:   . Worried About Charity fundraiser in the Last Year: Not on file  . Ran Out of Food in the Last Year: Not on file  Transportation Needs:   . Lack of Transportation (Medical): Not on file  . Lack of Transportation (Non-Medical): Not on file  Physical Activity:   . Days of Exercise per Week: Not on file  . Minutes of Exercise per Session: Not on file  Stress:   . Feeling of Stress : Not on file  Social Connections:   . Frequency of Communication with Friends and Family: Not on file  . Frequency of Social Gatherings with Friends and Family: Not on file  . Attends Religious Services: Not on file  . Active Member of Clubs or Organizations: Not on file  . Attends Archivist Meetings: Not on file  . Marital Status: Not on file  Intimate Partner Violence:   . Fear of Current or Ex-Partner: Not on file  . Emotionally Abused: Not on file  . Physically Abused: Not on file  . Sexually Abused: Not on file     Current Outpatient Medications:  .  amLODipine (NORVASC) 5 MG tablet, Take 1 tablet (5 mg total) by mouth daily., Disp: 90 tablet, Rfl: 1 .  Ferrous Sulfate (IRON) 325 (65 Fe) MG TABS, Take by mouth., Disp: , Rfl:  .  Iron-FA-B Cmp-C-Biot-Probiotic (FUSION PLUS) CAPS, Take 1 tablet by mouth daily. (Patient not taking: Reported on 08/31/2019), Disp: 30 capsule, Rfl: 3  EXAM:  VITALS per patient if applicable: BP 258/52   Pulse 91   Ht 5' 7"  (1.702 m)   Wt 145 lb 12.8 oz (66.1 kg)   LMP 05/13/2018   BMI 22.84 kg/m   GENERAL: alert, oriented, appears well and in no acute distress  HEENT: atraumatic, conjunttiva clear, no obvious abnormalities on inspection of external nose and ears  NECK: normal movements of the head and neck  LUNGS: on inspection no signs of respiratory distress, breathing rate appears normal, no obvious gross SOB, gasping or wheezing  CV: no obvious cyanosis  MS: moves all visible extremities without noticeable abnormality   PSYCH/NEURO: pleasant and cooperative, no obvious depression or anxiety, speech and thought processing grossly intact  ASSESSMENT AND PLAN:  Discussed the following assessment and plan:  Essential hypertension BP is much better controlled at this time, continue amlodipine at current dose.  Recommend low salt diet.  F/u in 6 months.       I discussed the assessment and treatment plan with the patient. The patient was provided an opportunity to ask questions and all were answered. The patient agreed with the plan and demonstrated an understanding of the instructions.   The patient was advised to call back or seek an in-person evaluation if the symptoms worsen or if the condition fails to improve as anticipated.    Sabrina Nutting, DO

## 2019-12-12 DIAGNOSIS — D259 Leiomyoma of uterus, unspecified: Secondary | ICD-10-CM | POA: Diagnosis not present

## 2019-12-12 DIAGNOSIS — N760 Acute vaginitis: Secondary | ICD-10-CM | POA: Diagnosis not present

## 2019-12-12 DIAGNOSIS — Z118 Encounter for screening for other infectious and parasitic diseases: Secondary | ICD-10-CM | POA: Diagnosis not present

## 2019-12-12 MED FILL — METRONIDAZOLE 500 MG TABS: 500 | 7 days supply | Qty: 14 | Fill #0

## 2019-12-20 MED FILL — LUPRON DEPOT 3.75 MG KIT: 3.75 | 28 days supply | Qty: 1 | Fill #0

## 2019-12-21 DIAGNOSIS — N809 Endometriosis, unspecified: Secondary | ICD-10-CM | POA: Diagnosis not present

## 2020-01-16 MED FILL — LUPRON DEPOT 3.75 MG KIT: 3.75 | 28 days supply | Qty: 1 | Fill #1

## 2020-01-22 DIAGNOSIS — N809 Endometriosis, unspecified: Secondary | ICD-10-CM | POA: Diagnosis not present

## 2020-02-12 MED FILL — LUPRON DEPOT 3.75 MG KIT: 3.75 | 28 days supply | Qty: 1 | Fill #2

## 2020-02-13 DIAGNOSIS — H524 Presbyopia: Secondary | ICD-10-CM | POA: Diagnosis not present

## 2020-02-19 DIAGNOSIS — N809 Endometriosis, unspecified: Secondary | ICD-10-CM | POA: Diagnosis not present

## 2020-03-15 MED FILL — AMLODIPINE BESYLATE 5 MG TA: 5 | 90 days supply | Qty: 90 | Fill #0

## 2020-03-15 MED FILL — LUPRON DEPOT 3.75 MG KIT: 3.75 | 28 days supply | Qty: 1 | Fill #3

## 2020-03-25 DIAGNOSIS — N809 Endometriosis, unspecified: Secondary | ICD-10-CM | POA: Diagnosis not present

## 2020-03-30 ENCOUNTER — Other Ambulatory Visit: Payer: Self-pay

## 2020-03-30 ENCOUNTER — Ambulatory Visit
Admission: EM | Admit: 2020-03-30 | Discharge: 2020-03-30 | Disposition: A | Discharge status: Home / Self Care | Payer: 59 | Attending: Physician Assistant | Admitting: Physician Assistant

## 2020-03-30 DIAGNOSIS — N3090 Cystitis, unspecified without hematuria: Secondary | ICD-10-CM | POA: Diagnosis not present

## 2020-03-30 LAB — POCT URINALYSIS DIP (MANUAL ENTRY)
Bilirubin, UA: NEGATIVE
Glucose, UA: NEGATIVE mg/dL
Ketones, POC UA: NEGATIVE mg/dL
Nitrite, UA: NEGATIVE
Protein Ur, POC: >=300 mg/dL — AB
Spec Grav, UA: >=1.03 — AB (ref 1.010–1.025)
Urobilinogen, UA: 0.2 E.U./dL
pH, UA: 6 (ref 5.0–8.0)

## 2020-03-30 MED ORDER — FLUCONAZOLE 150 MG PO TABS
150.0000 mg | ORAL_TABLET | Freq: Every day | ORAL | 0 refills | Status: DC
Start: 2020-03-30 — End: 2020-09-19

## 2020-03-30 MED ORDER — CEPHALEXIN 500 MG PO CAPS
500.0000 mg | ORAL_CAPSULE | Freq: Two times a day (BID) | ORAL | 0 refills | Status: DC
Start: 2020-03-30 — End: 2020-09-19

## 2020-03-30 NOTE — ED Provider Notes (Signed)
[facility]    CSN: [id number] Arrival date & time: 03/30/20  1207      History   Chief Complaint Chief Complaint  Patient presents with  . Urinary Frequency    HPI [name] is a 49 y.o. female.   49 year old female comes in for 3 day history of urinary symptoms. Has had urinary frequency, low abdominal pressure with urination, urgency. Denies hematuria, but has noticed some blood with wiping.  Denies nausea, vomiting. Denies fever, chills, flank/back pain. Denies vaginal discharge, itching, spotting. LMP 10/2019. On lupron 01/2020.      Past Medical History:  Diagnosis Date  . Abnormal Pap smear   . Anemia   . Endometriosis   . Fibroids   . Ovarian cyst     Patient Active Problem List   Diagnosis Date Noted  . Essential hypertension 08/31/2019  . Well adult exam 05/13/2018  . Iron deficiency anemia 04/03/2016    Past Surgical History:  Procedure Laterality Date  .  diagnostic laparscopic  06/2004  . CHROMOPERTUBATION N/A 11/28/2013   Procedure: CHROMOPERTUBATION, ;  Surgeon: Lovenia Kim, MD;  Location: [facility];  Service: Gynecology;  Laterality: N/A;  . DIAGNOSTIC LAPAROSCOPY  2005   [place]  . DILATATION & CURETTAGE/HYSTEROSCOPY WITH MYOSURE N/A 12/30/2018   Procedure: DILATATION & CURETTAGE/HYSTEROSCOPY WITH MYOSURE;  Surgeon: Brien Few, MD;  Location: [facility];  Service: Gynecology;  Laterality: N/A;  . HYSTEROSCOPY WITH NOVASURE N/A 12/30/2018   Procedure: HYSTEROSCOPY WITH NOVASURE, POSSIBLE HYDROTHERMAL ABLATION;  Surgeon: Brien Few, MD;  Location: [facility];  Service: Gynecology;  Laterality: N/A;  . OVARIAN CYST REMOVAL  2015  . ROBOTIC ASSISTED LAPAROSCOPIC LYSIS OF ADHESION N/A 11/28/2013   Procedure: ROBOTIC ASSISTED LAPAROSCOPIC LYSIS OF ADHESION; EXCISION OF RIGHT OVARIAN CYST WALL AND MYOMECTOMY;  Surgeon: Lovenia Kim, MD;  Location: [facility];  Service: Gynecology;  Laterality: N/A;  . UTERINE FIBROID SURGERY   2015  . WISDOM TOOTH EXTRACTION      OB History    Gravida  0   Para  0   Term  0   Preterm  0   AB  0   Living  0     SAB  0   TAB  0   Ectopic  0   Multiple  0   Live Births  0           Home Medications    Prior to Admission medications   Medication Sig Start Date End Date Taking? Authorizing Provider  leuprolide (LUPRON DEPOT, 68-MONTH,) 3.75 MG injection Inject 3.75 mg into the muscle once.   Yes [provider]  amLODipine (NORVASC) 5 MG tablet Take 1 tablet (5 mg total) by mouth daily. 12/05/19   Luetta Nutting, DO  cephALEXin (KEFLEX) 500 MG capsule Take 1 capsule (500 mg total) by mouth 2 (two) times daily. 03/30/20   Tasia Catchings, Alaine Loughney V, PA-C  Ferrous Sulfate (IRON) 325 (65 Fe) MG TABS Take by mouth. 04/03/16   [provider]  fluconazole (DIFLUCAN) 150 MG tablet Take 1 tablet (150 mg total) by mouth daily. Take second dose 72 hours later if symptoms still persists. 03/30/20   Ansleigh Safer V, PA-C  Iron-FA-B Cmp-C-Biot-Probiotic (FUSION PLUS) CAPS Take 1 tablet by mouth daily. Patient not taking: Reported on 08/31/2019 01/13/19   Luetta Nutting, DO    Family History Family History  Problem Relation Age of Onset  . Hypertension Mother   . Stroke Mother   .  Hypertension Father   . Heart disease Father   . Stroke Maternal Grandfather   . Hypertension Maternal Grandfather   . Cancer Paternal Grandmother        stomach cancer  . Hyperlipidemia Paternal Grandmother     Social History Social History   Tobacco Use  . Smoking status: Never Smoker  . Smokeless tobacco: Never Used  Substance Use Topics  . Alcohol use: No  . Drug use: No     Allergies   Patient has no known allergies.   Review of Systems Review of Systems  Reason unable to perform ROS: See HPI as above.     Physical Exam Triage Vital Signs ED Triage Vitals [03/30/20 1220]  Enc Vitals Group     BP 132/87     Pulse Rate 92     Resp 15     Temp 97.8 F (36.6 C)     Temp  Source Oral     SpO2 98 %     Weight      Height      Head Circumference      Peak Flow      Pain Score 0     Pain Loc      Pain Edu?      Excl. in [place]?    No data found.  Updated Vital Signs BP 132/87 (BP Location: Left Arm)   Pulse 92   Temp 97.8 F (36.6 C) (Oral)   Resp 15   LMP 10/31/2019 (Within Weeks) Comment: on Lupron  SpO2 98%   Physical Exam Exam conducted with a chaperone present.  Constitutional:      General: She is not in acute distress.    Appearance: She is well-developed. She is not ill-appearing, toxic-appearing or diaphoretic.  HENT:     Head: Normocephalic and atraumatic.  Eyes:     Conjunctiva/sclera: Conjunctivae normal.     Pupils: Pupils are equal, round, and reactive to light.  Cardiovascular:     Rate and Rhythm: Normal rate and regular rhythm.  Pulmonary:     Effort: Pulmonary effort is normal. No respiratory distress.     Comments: LCTAB Abdominal:     General: Bowel sounds are normal.     Palpations: Abdomen is soft.     Tenderness: There is no abdominal tenderness. There is no right CVA tenderness, left CVA tenderness, guarding or rebound.  Genitourinary:    Comments: No rashes, open wounds to the labia. White clumpy discharge in the vaginal canal. No vaginal bleeding. Cervix normal without bleeding, friability.  Musculoskeletal:     Cervical back: Normal range of motion and neck supple.  Skin:    General: Skin is warm and dry.  Neurological:     Mental Status: She is alert and oriented to person, place, and time.  Psychiatric:        Behavior: Behavior normal.        Judgment: Judgment normal.      UC Treatments / Results  Labs (all labs ordered are listed, but only abnormal results are displayed) Labs Reviewed  POCT URINALYSIS DIP (MANUAL ENTRY) - Abnormal; Notable for the following components:      Result Value   Clarity, UA cloudy (*)    Spec Grav, UA >=1.030 (*)    Blood, UA large (*)    Protein Ur, POC >=300 (*)     Leukocytes, UA Large (3+) (*)    All other components within normal limits  URINE CULTURE  EKG   Radiology No results found.  Procedures Procedures (including critical care time)  Medications Ordered in UC Medications - No data to display  Initial Impression / Assessment and Plan / UC Course  I have reviewed the triage vital signs and the nursing notes.  Pertinent labs & imaging results that were available during my care of the patient were reviewed by me and considered in my medical decision making (see chart for details).    Urine with 3+ leuks. Large blood in urine, though patient also seeing blood when wiping. No blood in vaginal canal. For now, will treat for cystitis with keflex. Will cover for yeast given abx use and vaginal discharge on exam. Patient without vaginal itching/irritation. Push fluids. Monitor blood. Return precautions given. Patient expresses understanding and agrees to plan.  Final Clinical Impressions(s) / UC Diagnoses   Final diagnoses:  Cystitis    ED Prescriptions    Medication Sig Dispense Auth. Provider   cephALEXin (KEFLEX) 500 MG capsule Take 1 capsule (500 mg total) by mouth 2 (two) times daily. 10 capsule Rhilynn Preyer V, PA-C   fluconazole (DIFLUCAN) 150 MG tablet Take 1 tablet (150 mg total) by mouth daily. Take second dose 72 hours later if symptoms still persists. 2 tablet Ok Edwards, PA-C     PDMP not reviewed this encounter.   Ok Edwards, PA-C 03/30/20 1313

## 2020-03-30 NOTE — Discharge Instructions (Signed)
Your urine was positive for an urinary tract infection. Start keflex as directed. No blood in vaginal canal, but did see discharge similar to yeast. Start diflucan as directed. Keep hydrated, urine should be clear to pale yellow in color. Monitor for any worsening of symptoms, fever, worsening abdominal pain, nausea/vomiting, flank pain, follow up for reevaluation.

## 2020-03-30 NOTE — ED Triage Notes (Signed)
Patient presents with intermittent lower abdominal pressure and urinary frequency x 3 days. She has noticed a trace of blood when she wipes.

## 2020-04-02 LAB — URINE CULTURE: Culture: <10000 — AB

## 2020-04-18 MED FILL — LUPRON DEPOT 3.75 MG KIT: 3.75 | 28 days supply | Qty: 1 | Fill #4

## 2020-05-01 DIAGNOSIS — N809 Endometriosis, unspecified: Secondary | ICD-10-CM | POA: Diagnosis not present

## 2020-05-27 MED FILL — LUPRON DEPOT 3.75 MG KIT: 3.75 | 28 days supply | Qty: 1 | Fill #5

## 2020-06-20 MED FILL — AMLODIPINE BESYLATE 5 MG TA: 5 | 90 days supply | Qty: 90 | Fill #1

## 2020-07-03 DIAGNOSIS — Z20822 Contact with and (suspected) exposure to covid-19: Secondary | ICD-10-CM | POA: Diagnosis not present

## 2020-07-05 MED FILL — LUPRON DEPOT 3.75 MG KIT: 3.75 | 30 days supply | Qty: 1 | Fill #0

## 2020-07-09 DIAGNOSIS — N806 Endometriosis in cutaneous scar: Secondary | ICD-10-CM | POA: Diagnosis not present

## 2020-07-09 DIAGNOSIS — R300 Dysuria: Secondary | ICD-10-CM | POA: Diagnosis not present

## 2020-07-09 MED FILL — SULFAMETHOXAZOLE-TMP DS TAB: 800-160 | 3 days supply | Qty: 6 | Fill #0

## 2020-07-30 MED FILL — SODIUM FLUORIDE 5000 PLUS 1: 1.1 | 15 days supply | Qty: 51 | Fill #0

## 2020-08-22 MED FILL — SODIUM FLUORIDE 5000 PLUS 1: 1.1 | 30 days supply | Qty: 51 | Fill #0

## 2020-09-02 DIAGNOSIS — N809 Endometriosis, unspecified: Secondary | ICD-10-CM | POA: Diagnosis not present

## 2020-09-16 MED FILL — SODIUM FLUORIDE 5000 PLUS 1: 1.1 | 30 days supply | Qty: 51 | Fill #1

## 2020-09-19 ENCOUNTER — Ambulatory Visit (INDEPENDENT_AMBULATORY_CARE_PROVIDER_SITE_OTHER): Payer: 59 | Admitting: Family Medicine

## 2020-09-19 ENCOUNTER — Encounter: Payer: Self-pay | Admitting: Family Medicine

## 2020-09-19 ENCOUNTER — Other Ambulatory Visit: Payer: Self-pay | Admitting: Family Medicine

## 2020-09-19 VITALS — BP 131/81 | HR 78 | Temp 98.0°F | Ht 66.73 in | Wt 141.4 lb

## 2020-09-19 DIAGNOSIS — Z0000 Encounter for general adult medical examination without abnormal findings: Secondary | ICD-10-CM | POA: Diagnosis not present

## 2020-09-19 DIAGNOSIS — Z13220 Encounter for screening for lipoid disorders: Secondary | ICD-10-CM | POA: Diagnosis not present

## 2020-09-19 DIAGNOSIS — D509 Iron deficiency anemia, unspecified: Secondary | ICD-10-CM

## 2020-09-19 DIAGNOSIS — I10 Essential (primary) hypertension: Secondary | ICD-10-CM

## 2020-09-19 MED ORDER — AMLODIPINE BESYLATE 5 MG PO TABS: 5.0000 mg | ORAL_TABLET | Freq: Every day | ORAL | 1 refills | Status: DC

## 2020-09-19 MED FILL — AMLODIPINE BESYLATE 5 MG TA: 5 | 90 days supply | Qty: 90 | Fill #0

## 2020-09-19 NOTE — Assessment & Plan Note (Signed)
BP remains well controlled. Continue amlodipine at current strength.

## 2020-09-19 NOTE — Assessment & Plan Note (Signed)
Well adult Orders Placed This Encounter  Procedures  . COMPLETE METABOLIC PANEL WITH GFR  . CBC  . Lipid Profile  . Fe+TIBC+Fer  Screening: Lipid Immunizations: UTD Anticipatory guidance/Risk factor reduction:  Recommendations per AVS.

## 2020-09-19 NOTE — Patient Instructions (Signed)
Preventive Care 40-49 Years Old, Female °Preventive care refers to visits with your health care provider and lifestyle choices that can promote health and wellness. This includes: °· A yearly physical exam. This may also be called an annual well check. °· Regular dental visits and eye exams. °· Immunizations. °· Screening for certain conditions. °· Healthy lifestyle choices, such as eating a healthy diet, getting regular exercise, not using drugs or products that contain nicotine and tobacco, and limiting alcohol use. °What can I expect for my preventive care visit? °Physical exam °Your health care provider will check your: °· Height and weight. This may be used to calculate body mass index (BMI), which tells if you are at a healthy weight. °· Heart rate and blood pressure. °· Skin for abnormal spots. °Counseling °Your health care provider may ask you questions about your: °· Alcohol, tobacco, and drug use. °· Emotional well-being. °· Home and relationship well-being. °· Sexual activity. °· Eating habits. °· Work and work environment. °· Method of birth control. °· Menstrual cycle. °· Pregnancy history. °What immunizations do I need? ° °Influenza (flu) vaccine °· This is recommended every year. °Tetanus, diphtheria, and pertussis (Tdap) vaccine °· You may need a Td booster every 10 years. °Varicella (chickenpox) vaccine °· You may need this if you have not been vaccinated. °Zoster (shingles) vaccine °· You may need this after age 60. °Measles, mumps, and rubella (MMR) vaccine °· You may need at least one dose of MMR if you were born in 1957 or later. You may also need a second dose. °Pneumococcal conjugate (PCV13) vaccine °· You may need this if you have certain conditions and were not previously vaccinated. °Pneumococcal polysaccharide (PPSV23) vaccine °· You may need one or two doses if you smoke cigarettes or if you have certain conditions. °Meningococcal conjugate (MenACWY) vaccine °· You may need this if you  have certain conditions. °Hepatitis A vaccine °· You may need this if you have certain conditions or if you travel or work in places where you may be exposed to hepatitis A. °Hepatitis B vaccine °· You may need this if you have certain conditions or if you travel or work in places where you may be exposed to hepatitis B. °Haemophilus influenzae type b (Hib) vaccine °· You may need this if you have certain conditions. °Human papillomavirus (HPV) vaccine °· If recommended by your health care provider, you may need three doses over 6 months. °You may receive vaccines as individual doses or as more than one vaccine together in one shot (combination vaccines). Talk with your health care provider about the risks and benefits of combination vaccines. °What tests do I need? °Blood tests °· Lipid and cholesterol levels. These may be checked every 5 years, or more frequently if you are over 49 years old. °· Hepatitis C test. °· Hepatitis B test. °Screening °· Lung cancer screening. You may have this screening every year starting at age 49 if you have a 30-pack-year history of smoking and currently smoke or have quit within the past 15 years. °· Colorectal cancer screening. All adults should have this screening starting at age 49 and continuing until age 75. Your health care provider may recommend screening at age 49 if you are at increased risk. You will have tests every 1-10 years, depending on your results and the type of screening test. °· Diabetes screening. This is done by checking your blood sugar (glucose) after you have not eaten for a while (fasting). You may have this   done every 1-3 years.  Mammogram. This may be done every 1-2 years. Talk with your health care provider about when you should start having regular mammograms. This may depend on whether you have a family history of breast cancer.  BRCA-related cancer screening. This may be done if you have a family history of breast, ovarian, tubal, or peritoneal  cancers.  Pelvic exam and Pap test. This may be done every 3 years starting at age 49. Starting at age 49, this may be done every 5 years if you have a Pap test in combination with an HPV test. Other tests  Sexually transmitted disease (STD) testing.  Bone density scan. This is done to screen for osteoporosis. You may have this scan if you are at high risk for osteoporosis. Follow these instructions at home: Eating and drinking  Eat a diet that includes fresh fruits and vegetables, whole grains, lean protein, and low-fat dairy.  Take vitamin and mineral supplements as recommended by your health care provider.  Do not drink alcohol if: ? Your health care provider tells you not to drink. ? You are pregnant, may be pregnant, or are planning to become pregnant.  If you drink alcohol: ? Limit how much you have to 0-1 drink a day. ? Be aware of how much alcohol is in your drink. In the [place] one drink equals one 12 oz bottle of beer (355 mL), one 5 oz glass of wine (148 mL), or one 1 oz glass of hard liquor (44 mL). Lifestyle  Take daily care of your teeth and gums.  Stay active. Exercise for at least 30 minutes on 5 or more days each week.  Do not use any products that contain nicotine or tobacco, such as cigarettes, e-cigarettes, and chewing tobacco. If you need help quitting, ask your health care provider.  If you are sexually active, practice safe sex. Use a condom or other form of birth control (contraception) in order to prevent pregnancy and STIs (sexually transmitted infections).  If told by your health care provider, take low-dose aspirin daily starting at age 49. What's next?  Visit your health care provider once a year for a well check visit.  Ask your health care provider how often you should have your eyes and teeth checked.  Stay up to date on all vaccines. This information is not intended to replace advice given to you by your health care provider. Make sure you  discuss any questions you have with your health care provider. Document Revised: 07/28/2018 Document Reviewed: 07/28/2018 Elsevier Patient Education  2020 [employer].

## 2020-09-19 NOTE — Progress Notes (Signed)
[name] - 49 y.o. female MRN [id number]  Date of birth: [birth year]  Subjective Chief Complaint  Patient presents with  . Annual Exam    HPI [name] is a 49 y.o. female here today for annual exam.  She has history of iron deficiency anemia due to AUB 2/2 to fibroids.  She has started on lupron and bleeding has improved significantly.  BP remains well  Controlled with amlodipine.  No symptoms related to HTN.    She recently changed positions at her job and her new position is less stressful.   She has had flu and COVID vaccines.  PAP is UTD   She is a non-smoker and denies EtOH use  She feels like diet is pretty good.    Review of Systems  Constitutional: Negative for chills, fever, malaise/fatigue and weight loss.  HENT: Negative for congestion, ear pain and sore throat.   Eyes: Negative for blurred vision, double vision and pain.  Respiratory: Negative for cough and shortness of breath.   Cardiovascular: Negative for chest pain and palpitations.  Gastrointestinal: Negative for abdominal pain, blood in stool, constipation, heartburn and nausea.  Genitourinary: Negative for dysuria and urgency.  Musculoskeletal: Negative for joint pain and myalgias.  Neurological: Negative for dizziness and headaches.  Endo/Heme/Allergies: Does not bruise/bleed easily.  Psychiatric/Behavioral: Negative for depression. The patient is not nervous/anxious and does not have insomnia.     No Known Allergies  Past Medical History:  Diagnosis Date  . Abnormal Pap smear   . Anemia   . Endometriosis   . Fibroids   . Ovarian cyst     Past Surgical History:  Procedure Laterality Date  .  diagnostic laparscopic  06/2004  . CHROMOPERTUBATION N/A 11/28/2013   Procedure: CHROMOPERTUBATION, ;  Surgeon: Lovenia Kim, MD;  Location: [facility];  Service: Gynecology;  Laterality: N/A;  . DIAGNOSTIC LAPAROSCOPY  2005   [place]  . DILATATION & CURETTAGE/HYSTEROSCOPY WITH MYOSURE  N/A 12/30/2018   Procedure: DILATATION & CURETTAGE/HYSTEROSCOPY WITH MYOSURE;  Surgeon: Brien Few, MD;  Location: [facility];  Service: Gynecology;  Laterality: N/A;  . HYSTEROSCOPY WITH NOVASURE N/A 12/30/2018   Procedure: HYSTEROSCOPY WITH NOVASURE, POSSIBLE HYDROTHERMAL ABLATION;  Surgeon: Brien Few, MD;  Location: [facility];  Service: Gynecology;  Laterality: N/A;  . OVARIAN CYST REMOVAL  2015  . ROBOTIC ASSISTED LAPAROSCOPIC LYSIS OF ADHESION N/A 11/28/2013   Procedure: ROBOTIC ASSISTED LAPAROSCOPIC LYSIS OF ADHESION; EXCISION OF RIGHT OVARIAN CYST WALL AND MYOMECTOMY;  Surgeon: Lovenia Kim, MD;  Location: [facility];  Service: Gynecology;  Laterality: N/A;  . UTERINE FIBROID SURGERY  2015  . WISDOM TOOTH EXTRACTION      Social History   Socioeconomic History  . Marital status: Married    Spouse name: Not on file  . Number of children: Not on file  . Years of education: Not on file  . Highest education level: Not on file  Occupational History  . Not on file  Tobacco Use  . Smoking status: Never Smoker  . Smokeless tobacco: Never Used  Vaping Use  . Vaping Use: Never used  Substance and Sexual Activity  . Alcohol use: No  . Drug use: No  . Sexual activity: Yes    Birth control/protection: None  Other Topics Concern  . Not on file  Social History Narrative  . Not on file   Social Determinants of Health   Financial Resource Strain:   . Difficulty of Paying Living Expenses: Not  on file  Food Insecurity:   . Worried About Charity fundraiser in the Last Year: Not on file  . Ran Out of Food in the Last Year: Not on file  Transportation Needs:   . Lack of Transportation (Medical): Not on file  . Lack of Transportation (Non-Medical): Not on file  Physical Activity:   . Days of Exercise per Week: Not on file  . Minutes of Exercise per Session: Not on file  Stress:   . Feeling of Stress : Not on file  Social Connections:   . Frequency of Communication with Friends  and Family: Not on file  . Frequency of Social Gatherings with Friends and Family: Not on file  . Attends Religious Services: Not on file  . Active Member of Clubs or Organizations: Not on file  . Attends Archivist Meetings: Not on file  . Marital Status: Not on file    Family History  Problem Relation Age of Onset  . Hypertension Mother   . Stroke Mother   . Hypertension Father   . Heart disease Father   . Stroke Maternal Grandfather   . Hypertension Maternal Grandfather   . Cancer Paternal Grandmother        stomach cancer  . Hyperlipidemia Paternal Grandmother     Health Maintenance  Topic Date Due  . Hepatitis C Screening  Never done  . HIV Screening  Never done  . PAP SMEAR-Modifier  09/12/2021  . TETANUS/TDAP  06/30/2026  . INFLUENZA VACCINE  Completed  . COVID-19 Vaccine  Completed     ----------------------------------------------------------------------------------------------------------------------------------------------------------------------------------------------------------------- Physical Exam BP 131/81 (BP Location: Left Arm, Patient Position: Sitting, Cuff Size: Small)   Pulse 78   Temp 98 F (36.7 C)   Ht 5' 6.73" (1.695 m)   Wt 141 lb 6.4 oz (64.1 kg)   SpO2 100%   BMI 22.32 kg/m   Physical Exam Constitutional:      General: She is not in acute distress. HENT:     Head: Normocephalic and atraumatic.     Nose: Nose normal.  Eyes:     General: No scleral icterus.    Conjunctiva/sclera: Conjunctivae normal.  Neck:     Thyroid: No thyromegaly.  Cardiovascular:     Rate and Rhythm: Normal rate and regular rhythm.     Heart sounds: Normal heart sounds.  Pulmonary:     Effort: Pulmonary effort is normal.     Breath sounds: Normal breath sounds.  Abdominal:     General: Bowel sounds are normal. There is no distension.     Palpations: Abdomen is soft.     Tenderness: There is no abdominal tenderness. There is no guarding.   Musculoskeletal:        General: Normal range of motion.     Cervical back: Normal range of motion and neck supple.  Lymphadenopathy:     Cervical: No cervical adenopathy.  Skin:    General: Skin is warm and dry.     Findings: No rash.  Neurological:     Mental Status: She is alert and oriented to person, place, and time.     Cranial Nerves: No cranial nerve deficit.     Coordination: Coordination normal.  Psychiatric:        Behavior: Behavior normal.     ------------------------------------------------------------------------------------------------------------------------------------------------------------------------------------------------------------------- Assessment and Plan  Essential hypertension BP remains well controlled. Continue amlodipine at current strength.   Well adult exam Well adult Orders Placed This Encounter  Procedures  .  COMPLETE METABOLIC PANEL WITH GFR  . CBC  . Lipid Profile  . Fe+TIBC+Fer  Screening: Lipid Immunizations: UTD Anticipatory guidance/Risk factor reduction:  Recommendations per AVS.     No orders of the defined types were placed in this encounter.   No follow-ups on file.    This visit occurred during the SARS-CoV-2 public health emergency.  Safety protocols were in place, including screening questions prior to the visit, additional usage of staff PPE, and extensive cleaning of exam room while observing appropriate contact time as indicated for disinfecting solutions.

## 2020-09-26 MED FILL — SODIUM FLUORIDE 5000 PLUS 1: 1.1 | 30 days supply | Qty: 51 | Fill #1

## 2020-10-02 DIAGNOSIS — Z01419 Encounter for gynecological examination (general) (routine) without abnormal findings: Secondary | ICD-10-CM | POA: Diagnosis not present

## 2020-10-02 DIAGNOSIS — Z1231 Encounter for screening mammogram for malignant neoplasm of breast: Secondary | ICD-10-CM | POA: Diagnosis not present

## 2020-10-02 DIAGNOSIS — R87610 Atypical squamous cells of undetermined significance on cytologic smear of cervix (ASC-US): Secondary | ICD-10-CM | POA: Diagnosis not present

## 2020-10-02 DIAGNOSIS — D259 Leiomyoma of uterus, unspecified: Secondary | ICD-10-CM | POA: Diagnosis not present

## 2020-10-02 DIAGNOSIS — N809 Endometriosis, unspecified: Secondary | ICD-10-CM | POA: Diagnosis not present

## 2020-10-02 DIAGNOSIS — Z6822 Body mass index (BMI) 22.0-22.9, adult: Secondary | ICD-10-CM | POA: Diagnosis not present

## 2020-10-02 LAB — HM MAMMOGRAPHY

## 2020-10-15 ENCOUNTER — Encounter: Payer: Self-pay | Admitting: Family Medicine

## 2020-11-01 DIAGNOSIS — N809 Endometriosis, unspecified: Secondary | ICD-10-CM | POA: Diagnosis not present

## 2020-11-15 MED FILL — SODIUM FLUORIDE 5000 PLUS 1: 1.1 | 30 days supply | Qty: 51 | Fill #2

## 2020-11-21 ENCOUNTER — Telehealth (INDEPENDENT_AMBULATORY_CARE_PROVIDER_SITE_OTHER): Payer: 59 | Admitting: Family Medicine

## 2020-11-21 ENCOUNTER — Encounter: Payer: Self-pay | Admitting: Family Medicine

## 2020-11-21 DIAGNOSIS — K047 Periapical abscess without sinus: Secondary | ICD-10-CM | POA: Diagnosis not present

## 2020-11-21 MED ORDER — HYDROCODONE-ACETAMINOPHEN 5-325 MG PO TABS: 1.0000 | ORAL_TABLET | Freq: Four times a day (QID) | ORAL | 0 refills | Status: DC | PRN

## 2020-11-21 MED ORDER — AMOXICILLIN 500 MG PO CAPS
500.0000 mg | ORAL_CAPSULE | Freq: Three times a day (TID) | ORAL | 0 refills | Status: DC
Start: 2020-11-21 — End: 2021-02-11

## 2020-11-21 NOTE — Progress Notes (Signed)
[name] - 49 y.o. female MRN [id number]  Date of birth: [birth year]   This visit type was conducted due to national recommendations for restrictions regarding the COVID-19 Pandemic (e.g. social distancing).  This format is felt to be most appropriate for this patient at this time.  All issues noted in this document were discussed and addressed.  No physical exam was performed (except for noted visual exam findings with Video Visits).  I discussed the limitations of evaluation and management by telemedicine and the availability of in person appointments. The patient expressed understanding and agreed to proceed.  I connected with@ on 11/21/20 at  1:20 PM EST by a video enabled telemedicine application and verified that I am speaking with the correct person using two identifiers.  Present at visit: Sabrina Nutting, Shields Beckham   Patient Location: Home [address] [address] [place] [address]   Provider location:   [place]  No chief complaint on file.   HPI  [name] is a 49 y.o. female who presents via audio/video conferencing for a telehealth visit today.  She has complaint of dental pain today.  She has pain in wisdom tooth and additional molar on the right side.  She has associated gum swelling but denies facial swelling, fever or difficulty with swallowing.  She has an appointment on Jan 4th with an oral surgeon for extraction.     ROS:  A comprehensive ROS was completed and negative except as noted per HPI  Past Medical History:  Diagnosis Date  . Abnormal Pap smear   . Anemia   . Endometriosis   . Fibroids   . Ovarian cyst     Past Surgical History:  Procedure Laterality Date  .  diagnostic laparscopic  06/2004  . CHROMOPERTUBATION N/A 11/28/2013   Procedure: CHROMOPERTUBATION, ;  Surgeon: Lovenia Kim, MD;  Location: [facility];  Service: Gynecology;  Laterality: N/A;  . DIAGNOSTIC LAPAROSCOPY  2005   [place]  . DILATATION & CURETTAGE/HYSTEROSCOPY  WITH MYOSURE N/A 12/30/2018   Procedure: DILATATION & CURETTAGE/HYSTEROSCOPY WITH MYOSURE;  Surgeon: Brien Few, MD;  Location: [facility];  Service: Gynecology;  Laterality: N/A;  . HYSTEROSCOPY WITH NOVASURE N/A 12/30/2018   Procedure: HYSTEROSCOPY WITH NOVASURE, POSSIBLE HYDROTHERMAL ABLATION;  Surgeon: Brien Few, MD;  Location: [facility];  Service: Gynecology;  Laterality: N/A;  . OVARIAN CYST REMOVAL  2015  . ROBOTIC ASSISTED LAPAROSCOPIC LYSIS OF ADHESION N/A 11/28/2013   Procedure: ROBOTIC ASSISTED LAPAROSCOPIC LYSIS OF ADHESION; EXCISION OF RIGHT OVARIAN CYST WALL AND MYOMECTOMY;  Surgeon: Lovenia Kim, MD;  Location: [facility];  Service: Gynecology;  Laterality: N/A;  . UTERINE FIBROID SURGERY  2015  . WISDOM TOOTH EXTRACTION      Family History  Problem Relation Age of Onset  . Hypertension Mother   . Stroke Mother   . Hypertension Father   . Heart disease Father   . Stroke Maternal Grandfather   . Hypertension Maternal Grandfather   . Cancer Paternal Grandmother        stomach cancer  . Hyperlipidemia Paternal Grandmother     Social History   Socioeconomic History  . Marital status: Married    Spouse name: Not on file  . Number of children: Not on file  . Years of education: Not on file  . Highest education level: Not on file  Occupational History  . Not on file  Tobacco Use  . Smoking status: Never Smoker  . Smokeless tobacco: Never Used  Vaping Use  . Vaping Use: Never used  Substance and Sexual Activity  . Alcohol use: No  . Drug use: No  . Sexual activity: Yes    Birth control/protection: None  Other Topics Concern  . Not on file  Social History Narrative  . Not on file   Social Determinants of Health   Financial Resource Strain: Not on file  Food Insecurity: Not on file  Transportation Needs: Not on file  Physical Activity: Not on file  Stress: Not on file  Social Connections: Not on file  Intimate Partner Violence: Not on file      Current Outpatient Medications:  .  amLODipine (NORVASC) 5 MG tablet, Take 1 tablet (5 mg total) by mouth daily., Disp: 90 tablet, Rfl: 1 .  leuprolide (LUPRON DEPOT, 40-MONTH,) 3.75 MG injection, Inject 3.75 mg into the muscle once., Disp: , Rfl:  .  amoxicillin (AMOXIL) 500 MG capsule, Take 1 capsule (500 mg total) by mouth 3 (three) times daily., Disp: 30 capsule, Rfl: 0 .  HYDROcodone-acetaminophen (NORCO) 5-325 MG tablet, Take 1 tablet by mouth every 6 (six) hours as needed for moderate pain or severe pain., Disp: 20 tablet, Rfl: 0  EXAM:  VITALS per patient if applicable: Wt 143 lb (64.9 kg)   BMI 22.58 kg/m   GENERAL: alert, oriented, appears well and in no acute distress  HEENT: atraumatic, conjunttiva clear, no obvious abnormalities on inspection of external nose and ears  NECK: normal movements of the head and neck  LUNGS: on inspection no signs of respiratory distress, breathing rate appears normal, no obvious gross SOB, gasping or wheezing  CV: no obvious cyanosis  MS: moves all visible extremities without noticeable abnormality  PSYCH/NEURO: pleasant and cooperative, no obvious depression or anxiety, speech and thought processing grossly intact  ASSESSMENT AND PLAN:  Discussed the following assessment and plan:  Dental infection Start amoxicillin 588m TID.  Norco 5/32105mshort term for as needed pain control.  She can also use ibuprofen as needed in addition to this.  Recommend warm salt water rinses.  Keep appt with oral surgeon for extraction.      I discussed the assessment and treatment plan with the patient. The patient was provided an opportunity to ask questions and all were answered. The patient agreed with the plan and demonstrated an understanding of the instructions.   The patient was advised to call back or seek an in-person evaluation if the symptoms worsen or if the condition fails to improve as anticipated.    CoLuetta NuttingDO

## 2020-11-21 NOTE — Progress Notes (Signed)
Appt for January 4th with oral surgeon for tooth extraction.  Face is not swollen. Throbbing constant pain.

## 2020-11-21 NOTE — Assessment & Plan Note (Signed)
Start amoxicillin 51m TID.  Norco 5/3264mshort term for as needed pain control.  She can also use ibuprofen as needed in addition to this.  Recommend warm salt water rinses.  Keep appt with oral surgeon for extraction.

## 2020-12-03 DIAGNOSIS — N809 Endometriosis, unspecified: Secondary | ICD-10-CM | POA: Diagnosis not present

## 2020-12-03 MED FILL — SODIUM FLUORIDE 5000 PLUS 1: 1.1 | 30 days supply | Qty: 51 | Fill #2

## 2021-01-01 ENCOUNTER — Other Ambulatory Visit (HOSPITAL_COMMUNITY): Payer: Self-pay | Admitting: Obstetrics and Gynecology

## 2021-01-01 DIAGNOSIS — N920 Excessive and frequent menstruation with regular cycle: Secondary | ICD-10-CM | POA: Diagnosis not present

## 2021-01-01 MED FILL — NORETHINDRONE 5 MG TABLET: 5 | 30 days supply | Qty: 30 | Fill #0

## 2021-01-06 LAB — COLOGUARD: Cologuard: NEGATIVE

## 2021-01-15 DIAGNOSIS — Z1212 Encounter for screening for malignant neoplasm of rectum: Secondary | ICD-10-CM | POA: Diagnosis not present

## 2021-01-15 DIAGNOSIS — Z1211 Encounter for screening for malignant neoplasm of colon: Secondary | ICD-10-CM | POA: Diagnosis not present

## 2021-01-23 LAB — COLOGUARD
COLOGUARD: NEGATIVE
COLOGUARD: NEGATIVE

## 2021-02-04 DIAGNOSIS — D259 Leiomyoma of uterus, unspecified: Secondary | ICD-10-CM | POA: Diagnosis not present

## 2021-02-11 ENCOUNTER — Ambulatory Visit: Payer: 59 | Admitting: Medical-Surgical

## 2021-02-11 ENCOUNTER — Encounter: Payer: Self-pay | Admitting: Medical-Surgical

## 2021-02-11 ENCOUNTER — Other Ambulatory Visit: Payer: Self-pay

## 2021-02-11 VITALS — BP 137/85 | HR 95 | Temp 98.6°F | Ht 66.75 in | Wt 149.8 lb

## 2021-02-11 DIAGNOSIS — R079 Chest pain, unspecified: Secondary | ICD-10-CM

## 2021-02-11 NOTE — Progress Notes (Signed)
Subjective:    CC: chest pain  HPI: Pleasant 50 year old female presenting for evaluation of chest pain that started at 2 AM this morning.  Notes the pain woke her from sleep.  Describes the pain as constant, dull, yet sharp.  Her chest is not sore to touch.  She notes that she was vacuuming and mopping yesterday and also rode her bike for a short while.  She did have a headache on Sunday that lingered and she treated with Aleve successfully.  Denies fever, chills, nausea, diaphoresis, dizziness, vision changes, shortness of breath, anxiety, recent vaccines, and reflux concerns.  No aggravating activities identified.  Notes that she did have some improvement when she was lying down yesterday since she felt more relaxed.  Has not tried any medications or home remedies, noting that she has a high tolerance for pain and she would rather just deal with it.  I reviewed the past medical history, family history, social history, surgical history, and allergies today and no changes were needed.  Please see the problem list section below in epic for further details.  Past Medical History: Past Medical History:  Diagnosis Date  . Abnormal Pap smear   . Anemia   . Endometriosis   . Fibroids   . Ovarian cyst    Past Surgical History: Past Surgical History:  Procedure Laterality Date  .  diagnostic laparscopic  06/2004  . CHROMOPERTUBATION N/A 11/28/2013   Procedure: CHROMOPERTUBATION, ;  Surgeon: Lovenia Kim, MD;  Location: [facility];  Service: Gynecology;  Laterality: N/A;  . DIAGNOSTIC LAPAROSCOPY  2005   [place]  . DILATATION & CURETTAGE/HYSTEROSCOPY WITH MYOSURE N/A 12/30/2018   Procedure: DILATATION & CURETTAGE/HYSTEROSCOPY WITH MYOSURE;  Surgeon: Brien Few, MD;  Location: [facility];  Service: Gynecology;  Laterality: N/A;  . HYSTEROSCOPY WITH NOVASURE N/A 12/30/2018   Procedure: HYSTEROSCOPY WITH NOVASURE, POSSIBLE HYDROTHERMAL ABLATION;  Surgeon: Brien Few, MD;  Location: [facility];   Service: Gynecology;  Laterality: N/A;  . OVARIAN CYST REMOVAL  2015  . ROBOTIC ASSISTED LAPAROSCOPIC LYSIS OF ADHESION N/A 11/28/2013   Procedure: ROBOTIC ASSISTED LAPAROSCOPIC LYSIS OF ADHESION; EXCISION OF RIGHT OVARIAN CYST WALL AND MYOMECTOMY;  Surgeon: Lovenia Kim, MD;  Location: [facility];  Service: Gynecology;  Laterality: N/A;  . UTERINE FIBROID SURGERY  2015  . WISDOM TOOTH EXTRACTION     Social History: Social History   Socioeconomic History  . Marital status: Married    Spouse name: Not on file  . Number of children: Not on file  . Years of education: Not on file  . Highest education level: Not on file  Occupational History  . Not on file  Tobacco Use  . Smoking status: Never Smoker  . Smokeless tobacco: Never Used  Vaping Use  . Vaping Use: Never used  Substance and Sexual Activity  . Alcohol use: No  . Drug use: No  . Sexual activity: Yes    Birth control/protection: None  Other Topics Concern  . Not on file  Social History Narrative  . Not on file   Social Determinants of Health   Financial Resource Strain: Not on file  Food Insecurity: Not on file  Transportation Needs: Not on file  Physical Activity: Not on file  Stress: Not on file  Social Connections: Not on file   Family History: Family History  Problem Relation Age of Onset  . Hypertension Mother   . Stroke Mother   . Hypertension Father   . Heart disease Father   .  Stroke Maternal Grandfather   . Hypertension Maternal Grandfather   . Cancer Paternal Grandmother        stomach cancer  . Hyperlipidemia Paternal Grandmother    Allergies: No Known Allergies Medications: See med rec.  Review of Systems: See HPI for pertinent positives and negatives.   Objective:    General: Well Developed, well nourished, and in no acute distress.  Neuro: Alert and oriented x3.  HEENT: Normocephalic, atraumatic.  Skin: Warm and dry. Cardiac: Regular rate and rhythm, no murmurs rubs or gallops, no  lower extremity edema.  Respiratory: Clear to auscultation bilaterally. Not using accessory muscles, speaking in full sentences.  Impression and Recommendations:    1. Chest pain, unspecified type EKG in office showing normal sinus rhythm with rate of 95, normal axis.  Getting chest x-ray.  Checking labs today are listed below.  Current unclear etiology but possible GI in nature.  Recommend trialing an over-the-counter antacid but can also consider ibuprofen/Tylenol in case this is musculoskeletal in nature.  Condition stable at time of departure from office with stable vital signs. - EKG 12-Lead - CBC w/Diff/Platelet - Amylase - Lipase - Troponin T - DG Chest 2 View; Future - TSH - Comprehensive Metabolic Panel (CMET)  Return if symptoms worsen or fail to improve. ___________________________________________ Clearnce Sorrel, DNP, APRN, FNP-BC [facility] [place]

## 2021-02-19 MED FILL — NORETHINDRONE 5 MG TABLET: 5 | 30 days supply | Qty: 30 | Fill #1

## 2021-02-20 ENCOUNTER — Other Ambulatory Visit (HOSPITAL_COMMUNITY): Payer: Self-pay | Admitting: Dentistry

## 2021-02-20 DIAGNOSIS — H524 Presbyopia: Secondary | ICD-10-CM | POA: Diagnosis not present

## 2021-02-20 MED FILL — SODIUM FLUORIDE 5000 PLUS 1: 1.1 | 30 days supply | Qty: 51 | Fill #0

## 2021-02-24 ENCOUNTER — Encounter: Payer: Self-pay | Admitting: Nurse Practitioner

## 2021-02-28 ENCOUNTER — Other Ambulatory Visit: Payer: Self-pay

## 2021-02-28 ENCOUNTER — Ambulatory Visit (INDEPENDENT_AMBULATORY_CARE_PROVIDER_SITE_OTHER): Payer: 59

## 2021-02-28 ENCOUNTER — Telehealth: Payer: Self-pay

## 2021-02-28 DIAGNOSIS — R079 Chest pain, unspecified: Secondary | ICD-10-CM

## 2021-02-28 NOTE — Telephone Encounter (Signed)
Pt called stating that she did not go downstairs to have the CXR or labwork done from her 02/11/2021 OV. She is wanting to know if the orders ares till active so she can come in and have the tests done. I told her that the orders were still in her chart and active, and that she can just come in and go downstairs to have the tests done. No further questions or concerns at this time.Marland Kitchen

## 2021-03-01 LAB — CBC WITH DIFFERENTIAL/PLATELET
Basophils Absolute: 0 10*3/uL (ref 0.0–0.2)
Basos: 0 %
EOS (ABSOLUTE): 0.1 10*3/uL (ref 0.0–0.4)
Eos: 1 %
Hematocrit: 43.6 % (ref 34.0–46.6)
Hemoglobin: 14.3 g/dL (ref 11.1–15.9)
Immature Grans (Abs): 0 10*3/uL (ref 0.0–0.1)
Immature Granulocytes: 0 %
Lymphocytes Absolute: 2.3 10*3/uL (ref 0.7–3.1)
Lymphs: 41 %
MCH: 28.9 pg (ref 26.6–33.0)
MCHC: 32.8 g/dL (ref 31.5–35.7)
MCV: 88 fL (ref 79–97)
Monocytes Absolute: 0.3 10*3/uL (ref 0.1–0.9)
Monocytes: 5 %
Neutrophils Absolute: 3 10*3/uL (ref 1.4–7.0)
Neutrophils: 53 %
Platelets: 325 10*3/uL (ref 150–450)
RBC: 4.95 x10E6/uL (ref 3.77–5.28)
RDW: 13.9 % (ref 11.7–15.4)
WBC: 5.7 10*3/uL (ref 3.4–10.8)

## 2021-03-01 LAB — TROPONIN T: Troponin T (Highly Sensitive): 7 ng/L (ref 0–14)

## 2021-03-01 LAB — TSH: TSH: 1.58 u[IU]/mL (ref 0.450–4.500)

## 2021-03-01 LAB — LIPASE: Lipase: 35 U/L (ref 14–72)

## 2021-03-01 LAB — AMYLASE: Amylase: 80 U/L (ref 31–110)

## 2021-03-06 ENCOUNTER — Ambulatory Visit
Admission: EM | Admit: 2021-03-06 | Discharge: 2021-03-06 | Disposition: A | Discharge status: Home / Self Care | Payer: 59

## 2021-03-06 ENCOUNTER — Other Ambulatory Visit: Payer: Self-pay

## 2021-03-06 DIAGNOSIS — J069 Acute upper respiratory infection, unspecified: Secondary | ICD-10-CM

## 2021-03-06 MED ORDER — AMOXICILLIN-POT CLAVULANATE 875-125 MG PO TABS
1.0000 | ORAL_TABLET | Freq: Two times a day (BID) | ORAL | 0 refills | Status: DC
Start: 2021-03-10 — End: 2021-03-11

## 2021-03-06 MED ORDER — FLUTICASONE PROPIONATE 50 MCG/ACT NA SUSP: 1.0000 | Freq: Every day | NASAL | 0 refills | Status: DC

## 2021-03-06 MED ORDER — DM-GUAIFENESIN ER 30-600 MG PO TB12
1.0000 | ORAL_TABLET | Freq: Two times a day (BID) | ORAL | 0 refills | Status: DC
Start: 2021-03-06 — End: 2021-03-11

## 2021-03-06 MED ORDER — BENZONATATE 200 MG PO CAPS
200.0000 mg | ORAL_CAPSULE | Freq: Three times a day (TID) | ORAL | 0 refills | Status: AC | PRN
Start: 2021-03-06 — End: 2021-03-13

## 2021-03-06 NOTE — ED Provider Notes (Signed)
[facility]    CSN: [id number] Arrival date & time: 03/06/21  1128      History   Chief Complaint Chief Complaint  Patient presents with  . Nasal Congestion    HPI [name] is a 50 y.o. female history of hypertension presenting today for evaluation of congestion/cough. Initially started on sat/sun with sinus congestion and sore throat. Taking zyrtec and salt water gargles. Symptoms settling into chest more. Denies SOB. Reports heaviness in chest. Denies history of asthma/COPD/tobacco use.  Chest x-ray 1 week ago for evaluation of chest pain which is normal.  Denies fevers.  Took Covid test earlier in the week which was negative.  HPI  Past Medical History:  Diagnosis Date  . Abnormal Pap smear   . Anemia   . Endometriosis   . Fibroids   . Ovarian cyst     Patient Active Problem List   Diagnosis Date Noted  . Dental infection 11/21/2020  . Essential hypertension 08/31/2019  . Well adult exam 05/13/2018  . Iron deficiency anemia 04/03/2016    Past Surgical History:  Procedure Laterality Date  .  diagnostic laparscopic  06/2004  . CHROMOPERTUBATION N/A 11/28/2013   Procedure: CHROMOPERTUBATION, ;  Surgeon: Lovenia Kim, MD;  Location: [facility];  Service: Gynecology;  Laterality: N/A;  . DIAGNOSTIC LAPAROSCOPY  2005   [place]  . DILATATION & CURETTAGE/HYSTEROSCOPY WITH MYOSURE N/A 12/30/2018   Procedure: DILATATION & CURETTAGE/HYSTEROSCOPY WITH MYOSURE;  Surgeon: Brien Few, MD;  Location: [facility];  Service: Gynecology;  Laterality: N/A;  . HYSTEROSCOPY WITH NOVASURE N/A 12/30/2018   Procedure: HYSTEROSCOPY WITH NOVASURE, POSSIBLE HYDROTHERMAL ABLATION;  Surgeon: Brien Few, MD;  Location: [facility];  Service: Gynecology;  Laterality: N/A;  . OVARIAN CYST REMOVAL  2015  . ROBOTIC ASSISTED LAPAROSCOPIC LYSIS OF ADHESION N/A 11/28/2013   Procedure: ROBOTIC ASSISTED LAPAROSCOPIC LYSIS OF ADHESION; EXCISION OF RIGHT OVARIAN CYST WALL AND  MYOMECTOMY;  Surgeon: Lovenia Kim, MD;  Location: [facility];  Service: Gynecology;  Laterality: N/A;  . UTERINE FIBROID SURGERY  2015  . WISDOM TOOTH EXTRACTION      OB History    Gravida  0   Para  0   Term  0   Preterm  0   AB  0   Living  0     SAB  0   IAB  0   Ectopic  0   Multiple  0   Live Births  0            Home Medications    Prior to Admission medications   Medication Sig Start Date End Date Taking? Authorizing Provider  amoxicillin-clavulanate (AUGMENTIN) 875-125 MG tablet Take 1 tablet by mouth every 12 (twelve) hours for 7 days. 03/10/21 03/17/21 Yes Natoya Viscomi C, PA-C  benzonatate (TESSALON) 200 MG capsule Take 1 capsule (200 mg total) by mouth 3 (three) times daily as needed for up to 7 days for cough. 03/06/21 03/13/21 Yes Ivry Pigue C, PA-C  dextromethorphan-guaiFENesin (MUCINEX DM) 30-600 MG 12hr tablet Take 1 tablet by mouth 2 (two) times daily. 03/06/21  Yes Elisha Cooksey C, PA-C  fluticasone (FLONASE) 50 MCG/ACT nasal spray Place 1-2 sprays into both nostrils daily. 03/06/21  Yes Kermitt Harjo C, PA-C  norethindrone (AYGESTIN) 5 MG tablet  02/19/21  Yes [provider]  amLODipine (NORVASC) 5 MG tablet TAKE 1 TABLET BY MOUTH ONCE A DAY 09/19/20 09/19/21  Luetta Nutting, DO  leuprolide (LUPRON DEPOT, 67-MONTH,) 3.75  MG injection Inject 3.75 mg into the muscle once.    [provider]  norethindrone (AYGESTIN) 5 MG tablet TAKE 1 TABLET BY MOUTH DAILY 01/01/21 01/01/22  Brien Few, MD  sodium fluoride (PREVIDENT 5000 PLUS) 1.1 % CREA dental cream APPLY A PEA-SIZED AMOUNT OF THE PASTE TO THE TOOTHBRUSH AND BRUSH THOROUGHLY FOR 2 MINUTES, PREFERABLY AT BEDTIME. 02/20/21 02/20/22  Ellis Parents, DDS    Family History Family History  Problem Relation Age of Onset  . Hypertension Mother   . Stroke Mother   . Hypertension Father   . Heart disease Father   . Stroke Maternal Grandfather   . Hypertension Maternal Grandfather    . Cancer Paternal Grandmother        stomach cancer  . Hyperlipidemia Paternal Grandmother     Social History Social History   Tobacco Use  . Smoking status: Never Smoker  . Smokeless tobacco: Never Used  Vaping Use  . Vaping Use: Never used  Substance Use Topics  . Alcohol use: No  . Drug use: No     Allergies   Patient has no known allergies.   Review of Systems Review of Systems  Constitutional: Negative for activity change, appetite change, chills, fatigue and fever.  HENT: Positive for congestion, rhinorrhea, sinus pressure and sore throat. Negative for ear pain and trouble swallowing.   Eyes: Negative for discharge and redness.  Respiratory: Positive for cough. Negative for chest tightness and shortness of breath.   Cardiovascular: Negative for chest pain.  Gastrointestinal: Negative for abdominal pain, diarrhea, nausea and vomiting.  Musculoskeletal: Negative for myalgias.  Skin: Negative for rash.  Neurological: Negative for dizziness, light-headedness and headaches.     Physical Exam Triage Vital Signs ED Triage Vitals  Enc Vitals Group     BP      Pulse      Resp      Temp      Temp src      SpO2      Weight      Height      Head Circumference      Peak Flow      Pain Score      Pain Loc      Pain Edu?      Excl. in [place]?    No data found.  Updated Vital Signs BP 140/82 (BP Location: Left Arm)   Pulse (!) 102   Temp 98.6 F (37 C) (Oral)   Resp 17   SpO2 95%   Visual Acuity Right Eye Distance:   Left Eye Distance:   Bilateral Distance:    Right Eye Near:   Left Eye Near:    Bilateral Near:     Physical Exam Vitals and nursing note reviewed.  Constitutional:      Appearance: She is well-developed.     Comments: No acute distress  HENT:     Head: Normocephalic and atraumatic.     Ears:     Comments: Bilateral ears without tenderness to palpation of external auricle, tragus and mastoid, EAC's without erythema or swelling, TM's  with good bony landmarks and cone of light. Non erythematous.     Nose: Nose normal.     Mouth/Throat:     Comments: Oral mucosa pink and moist, no tonsillar enlargement or exudate. Posterior pharynx patent and nonerythematous, no uvula deviation or swelling. Normal phonation. Eyes:     Conjunctiva/sclera: Conjunctivae normal.  Cardiovascular:     Rate and Rhythm:  Tachycardia present.  Pulmonary:     Effort: Pulmonary effort is normal. No respiratory distress.     Comments: Breathing comfortably at rest, CTABL, no wheezing, rales or other adventitious sounds auscultated Abdominal:     General: There is no distension.  Musculoskeletal:        General: Normal range of motion.     Cervical back: Neck supple.  Skin:    General: Skin is warm and dry.  Neurological:     Mental Status: She is alert and oriented to person, place, and time.      UC Treatments / Results  Labs (all labs ordered are listed, but only abnormal results are displayed) Labs Reviewed - No data to display  EKG   Radiology No results found.  Procedures Procedures (including critical care time)  Medications Ordered in UC Medications - No data to display  Initial Impression / Assessment and Plan / UC Course  I have reviewed the triage vital signs and the nursing notes.  Pertinent labs & imaging results that were available during my care of the patient were reviewed by me and considered in my medical decision making (see chart for details).     Viral URI with cough-exam reassuring, vital signs stable does have some mild tachycardia, suspect likely viral etiology and recommending continued symptomatic and supportive care over the next 3 to 4 days.  Did fill prescription for Augmentin to fill on Monday to treat for sinusitis if still not having any improvement with recommendations provided today.  Discussed strict return precautions. Patient verbalized understanding and is agreeable with plan.  Final  Clinical Impressions(s) / UC Diagnoses   Final diagnoses:  Viral URI with cough     Discharge Instructions     Continue Zyrtec, add in Flonase nasal spray 1 to 2 spray in each nostril daily Tessalon/benzonatate every 8 hours for cough as needed Supplement with Mucinex DM twice daily to further help with congestion and cough Rest and fluids May fill prescription for Augmentin on Monday if still not seeing any improvement with the above and an additional 3 to 4 days. Please return if any symptoms worsening or changing    ED Prescriptions    Medication Sig Dispense Auth. Provider   fluticasone (FLONASE) 50 MCG/ACT nasal spray Place 1-2 sprays into both nostrils daily. 16 g Rykker Coviello C, PA-C   benzonatate (TESSALON) 200 MG capsule Take 1 capsule (200 mg total) by mouth 3 (three) times daily as needed for up to 7 days for cough. 28 capsule Oluwakemi Salsberry C, PA-C   dextromethorphan-guaiFENesin (MUCINEX DM) 30-600 MG 12hr tablet Take 1 tablet by mouth 2 (two) times daily. 20 tablet Mylo Driskill C, PA-C   amoxicillin-clavulanate (AUGMENTIN) 875-125 MG tablet Take 1 tablet by mouth every 12 (twelve) hours for 7 days. 14 tablet Cova Knieriem, [place] C, PA-C     PDMP not reviewed this encounter.   Criselda Starke, [place] C, PA-C 03/06/21 1226

## 2021-03-06 NOTE — ED Triage Notes (Signed)
Pt is here with congestion that started Saturday, pt has taken Zyrtec to relieve discomfort. Monday she took a COVID test that resulted NEGATIVE.

## 2021-03-06 NOTE — Discharge Instructions (Signed)
Continue Zyrtec, add in Flonase nasal spray 1 to 2 spray in each nostril daily Tessalon/benzonatate every 8 hours for cough as needed Supplement with Mucinex DM twice daily to further help with congestion and cough Rest and fluids May fill prescription for Augmentin on Monday if still not seeing any improvement with the above and an additional 3 to 4 days. Please return if any symptoms worsening or changing

## 2021-03-07 ENCOUNTER — Ambulatory Visit: Payer: 59

## 2021-03-07 DIAGNOSIS — D259 Leiomyoma of uterus, unspecified: Secondary | ICD-10-CM | POA: Diagnosis not present

## 2021-03-10 ENCOUNTER — Other Ambulatory Visit (HOSPITAL_COMMUNITY): Payer: Self-pay

## 2021-03-10 DIAGNOSIS — J342 Deviated nasal septum: Secondary | ICD-10-CM | POA: Diagnosis not present

## 2021-03-10 DIAGNOSIS — J343 Hypertrophy of nasal turbinates: Secondary | ICD-10-CM | POA: Diagnosis not present

## 2021-03-10 DIAGNOSIS — J310 Chronic rhinitis: Secondary | ICD-10-CM | POA: Diagnosis not present

## 2021-03-10 MED ORDER — IPRATROPIUM BROMIDE 0.06 % NA SOLN
NASAL | 6 refills | Status: DC
  Filled 2021-03-10: qty 15, 20d supply, fill #0
  Filled 2021-03-26: qty 15, 30d supply, fill #0

## 2021-03-11 ENCOUNTER — Encounter: Payer: Self-pay | Admitting: Nurse Practitioner

## 2021-03-11 ENCOUNTER — Ambulatory Visit: Payer: 59 | Admitting: Nurse Practitioner

## 2021-03-11 ENCOUNTER — Other Ambulatory Visit (HOSPITAL_COMMUNITY): Payer: Self-pay

## 2021-03-11 VITALS — BP 116/62 | HR 84 | Ht 67.0 in | Wt 148.2 lb

## 2021-03-11 DIAGNOSIS — R079 Chest pain, unspecified: Secondary | ICD-10-CM | POA: Diagnosis not present

## 2021-03-11 DIAGNOSIS — R143 Flatulence: Secondary | ICD-10-CM | POA: Diagnosis not present

## 2021-03-11 MED ORDER — OMEPRAZOLE 40 MG PO CPDR
40.0000 mg | DELAYED_RELEASE_CAPSULE | Freq: Every day | ORAL | 3 refills | Status: DC
Start: 2021-03-11 — End: 2022-07-22
  Filled 2021-03-11 – 2021-03-26 (×2): qty 30, 30d supply, fill #0

## 2021-03-11 NOTE — Progress Notes (Signed)
ASSESSMENT AND PLAN     #50 year old female with constant, mild chest discomfort x 1 month and worse in lying position. Discomfort is unrelated to eating or activity. This doesn't sound like reflux related discomfort but it is reasonable to treat empirically for it  --Omeprazole 40 mg 30 minutes before breakfast.  --Anti-reflux measures discussed. Certain food / beverages may make GERD symptoms worse such as alcohol, chocolate, fried or high fat foods, peppermint, carbonated beverages, spicy foods, onions, tomatoes, garlic, and tomato-based foods, citrus fruits, and juices.  Avoid late evening meals / bedtime snacking.  If able, elevate head of bed 6-8 inches. If unable to elevate the head of the bed consider a wedge pillow.   Weight reduction / maintaining a healthy BMI (body mass index) is important as increased abdominal girth is associated with reflux.  --Follow-up with me in 3 to 4 weeks  # Excessive flatus gas in evening and during the night.  --Discussed possible culprit foods such as cruciferous veggies, beans etc. She has noticed a correlation with diary and takes Lactaid.  --Low gas diet given --Continue Gas-X  # Colon cancer screening. She reports a negative Cologuard two months ago by GYN.  --Advised she will need repeat Cologuard in 3 years or a colonoscopy.   HISTORY OF PRESENT ILLNESS     Chief Complaint : gas and chest discomfort  [name] is 50 y.o. female with a past medical history significant for HTN  Patient is new to the practice here for evaluation of gas and chest pain.  She saw PCP mid March for evaluation of chest pain.  EKG showed NSR.  Several labs were ordered that day but not done until 02/28/21. .    02/28/21 Normal CBC,  Amylase, lipase, liver chemistries, normal troponin,  normal TSH.  Chest x-ray on that day was normal.   Patient gives a one month history of constant, mild mid chest discomfort. The discomfort does not radiate anywhere and  isn't associated with SOB.  Eating does not affect the discomfort. She notices the discomfort mainly when lying down but sometimes notices it when mopping. She has taken Tums a few times but they didn't help.  She has not tried any other acid reducers .  She has not noticed any regurgitation.  Patient also complains of excessive flatus at night.  Patient says she has always been on the gassy side but it has been much worse lately.  Husband notices it when she is sleeping.  Gas-X helps.  Patient denies bowel changes.  She has not seen any blood in her stool.  She reports a negative Cologuard 2 months ago for her GYN . No [place] of colon cancer.    Past Medical History:  Diagnosis Date  . Abnormal Pap smear   . Anemia   . Endometriosis   . Fibroids   . Ovarian cyst   Hypertension   Past Surgical History:  Procedure Laterality Date  .  diagnostic laparscopic  06/2004  . CHROMOPERTUBATION N/A 11/28/2013   Procedure: CHROMOPERTUBATION, ;  Surgeon: Lovenia Kim, MD;  Location: [facility];  Service: Gynecology;  Laterality: N/A;  . DIAGNOSTIC LAPAROSCOPY  2005   [place]  . DILATATION & CURETTAGE/HYSTEROSCOPY WITH MYOSURE N/A 12/30/2018   Procedure: DILATATION & CURETTAGE/HYSTEROSCOPY WITH MYOSURE;  Surgeon: Brien Few, MD;  Location: [facility];  Service: Gynecology;  Laterality: N/A;  . HYSTEROSCOPY WITH NOVASURE N/A 12/30/2018   Procedure: HYSTEROSCOPY WITH NOVASURE, POSSIBLE HYDROTHERMAL ABLATION;  Surgeon: Brien Few, MD;  Location: [facility];  Service: Gynecology;  Laterality: N/A;  . OVARIAN CYST REMOVAL  2015  . ROBOTIC ASSISTED LAPAROSCOPIC LYSIS OF ADHESION N/A 11/28/2013   Procedure: ROBOTIC ASSISTED LAPAROSCOPIC LYSIS OF ADHESION; EXCISION OF RIGHT OVARIAN CYST WALL AND MYOMECTOMY;  Surgeon: Lovenia Kim, MD;  Location: [facility];  Service: Gynecology;  Laterality: N/A;  . UTERINE FIBROID SURGERY  2015  . WISDOM TOOTH EXTRACTION     Family History  Problem Relation Age of Onset   . Hypertension Mother   . Stroke Mother   . Hypertension Father   . Heart disease Father   . Stroke Maternal Grandfather   . Hypertension Maternal Grandfather   . Cancer Paternal Grandmother        stomach cancer  . Hyperlipidemia Paternal Grandmother    Social History   Tobacco Use  . Smoking status: Never Smoker  . Smokeless tobacco: Never Used  Vaping Use  . Vaping Use: Never used  Substance Use Topics  . Alcohol use: No  . Drug use: No   Current Outpatient Medications  Medication Sig Dispense Refill  . amLODipine (NORVASC) 5 MG tablet TAKE 1 TABLET BY MOUTH ONCE A DAY 90 tablet 1  . benzonatate (TESSALON) 200 MG capsule Take 1 capsule (200 mg total) by mouth 3 (three) times daily as needed for up to 7 days for cough. 28 capsule 0  . dextromethorphan-guaiFENesin (MUCINEX DM) 30-600 MG 12hr tablet Take 1 tablet by mouth 2 (two) times daily. 20 tablet 0  . fluticasone (FLONASE) 50 MCG/ACT nasal spray Place 1-2 sprays into both nostrils daily. 16 g 0  . ipratropium (ATROVENT) 0.06 % nasal spray Use 1-2 sprays in each nostril twice daily as needed for drainage 15 mL 6  . leuprolide (LUPRON DEPOT, 72-MONTH,) 3.75 MG injection Inject 3.75 mg into the muscle once.    . norethindrone (AYGESTIN) 5 MG tablet TAKE 1 TABLET BY MOUTH DAILY 30 tablet 3  . norethindrone (AYGESTIN) 5 MG tablet     . sodium fluoride (PREVIDENT 5000 PLUS) 1.1 % CREA dental cream APPLY A PEA-SIZED AMOUNT OF THE PASTE TO THE TOOTHBRUSH AND BRUSH THOROUGHLY FOR 2 MINUTES, PREFERABLY AT BEDTIME. 51 g 2   No current facility-administered medications for this visit.   No Known Allergies   Review of Systems: Positive for menstrual pain  All other systems reviewed and negative except where noted in HPI.   PHYSICAL EXAM :    Wt Readings from Last 3 Encounters:  02/11/21 149 lb 12.8 oz (67.9 kg)  11/21/20 143 lb (64.9 kg)  09/19/20 141 lb 6.4 oz (64.1 kg)    BP 116/62   Pulse 84   Ht 5' 7"  (1.702 m)   Wt  148 lb 3.2 oz (67.2 kg)   BMI 23.21 kg/m  Constitutional:  Pleasant thin female in no acute distress. Psychiatric: Normal mood and affect. Behavior is normal. EENT: Pupils normal.  Conjunctivae are normal. No scleral icterus. Neck supple.  Cardiovascular: Normal rate, regular rhythm. No edema Pulmonary/chest: Effort normal and breath sounds normal. No wheezing, rales or rhonchi. Abdominal: Soft, nondistended, nontender. Bowel sounds active throughout. There are no masses palpable. No hepatomegaly. Neurological: Alert and oriented to person place and time. Skin: Skin is warm and dry. No rashes noted.  Tye Savoy, NP  03/11/2021, 8:32 AM

## 2021-03-11 NOTE — Patient Instructions (Addendum)
If you are age 50 or older, your body mass index should be between 23-30. Your Body mass index is 23.21 kg/m. If this is out of the aforementioned range listed, please consider follow up with your Primary Care Provider.  If you are age 75 or younger, your body mass index should be between 19-25. Your Body mass index is 23.21 kg/m. If this is out of the aformentioned range listed, please consider follow up with your Primary Care Provider.   START Omeprazole 40 mg 1 capsule every morning 30 minutes before breakfast.  Follow a low gast diet, and [place] GERD prevention techniques.   You have been scheduled to follow up with Sabrina Savoy, NP-C on Apr 01, 2021 at 8:30 am.  Thank you for entrusting me with your care and choosing [facility].  Sabrina Savoy, NP-C   Gastroesophageal Reflux Disease, Adult  Gastroesophageal reflux (GER) happens when acid from the stomach flows up into the tube that connects the mouth and the stomach (esophagus). Normally, food travels down the esophagus and stays in the stomach to be digested. With GER, food and stomach acid sometimes move back up into the esophagus. You may have a disease called gastroesophageal reflux disease (GERD) if the reflux:  Happens often.  Causes frequent or very bad symptoms.  Causes problems such as damage to the esophagus. When this happens, the esophagus becomes sore and swollen. Over time, GERD can make small holes (ulcers) in the lining of the esophagus. What are the causes? This condition is caused by a problem with the muscle between the esophagus and the stomach. When this muscle is weak or not normal, it does not close properly to keep food and acid from coming back up from the stomach. The muscle can be weak because of:  Tobacco use.  Pregnancy.  Having a certain type of hernia (hiatal hernia).  Alcohol use.  Certain foods and drinks, such as coffee, chocolate, onions, and peppermint. What increases the  risk?  Being overweight.  Having a disease that affects your connective tissue.  Taking NSAIDs, such a ibuprofen. What are the signs or symptoms?  Heartburn.  Difficult or painful swallowing.  The feeling of having a lump in the throat.  A bitter taste in the mouth.  Bad breath.  Having a lot of saliva.  Having an upset or bloated stomach.  Burping.  Chest pain. Different conditions can cause chest pain. Make sure you see your doctor if you have chest pain.  Shortness of breath or wheezing.  A long-term cough or a cough at night.  Wearing away of the surface of teeth (tooth enamel).  Weight loss. How is this treated?  Making changes to your diet.  Taking medicine.  Having surgery. Treatment will depend on how bad your symptoms are. Follow these instructions at home: Eating and drinking  Follow a diet as told by your doctor. You may need to avoid foods and drinks such as: ? Coffee and tea, with or without caffeine. ? Drinks that contain alcohol. ? Energy drinks and sports drinks. ? Bubbly (carbonated) drinks or sodas. ? Chocolate and cocoa. ? Peppermint and mint flavorings. ? Garlic and onions. ? Horseradish. ? Spicy and acidic foods. These include peppers, chili powder, curry powder, vinegar, hot sauces, and BBQ sauce. ? Citrus fruit juices and citrus fruits, such as oranges, lemons, and limes. ? Tomato-based foods. These include red sauce, chili, salsa, and pizza with red sauce. ? Fried and fatty foods. These include donuts,  french fries, potato chips, and high-fat dressings. ? High-fat meats. These include hot dogs, rib eye steak, sausage, ham, and bacon. ? High-fat dairy items, such as whole milk, butter, and cream cheese.  Eat small meals often. Avoid eating large meals.  Avoid drinking large amounts of liquid with your meals.  Avoid eating meals during the 2-3 hours before bedtime.  Avoid lying down right after you eat.  Do not exercise right  after you eat.   Lifestyle  Do not smoke or use any products that contain nicotine or tobacco. If you need help quitting, ask your doctor.  Try to lower your stress. If you need help doing this, ask your doctor.  If you are overweight, lose an amount of weight that is healthy for you. Ask your doctor about a safe weight loss goal.   General instructions  Pay attention to any changes in your symptoms.  Take over-the-counter and prescription medicines only as told by your doctor.  Do not take aspirin, ibuprofen, or other NSAIDs unless your doctor says it is okay.  Wear loose clothes. Do not wear anything tight around your waist.  Raise (elevate) the head of your bed about 6 inches (15 cm). You may need to use a wedge to do this.  Avoid bending over if this makes your symptoms worse.  Keep all follow-up visits. Contact a doctor if:  You have new symptoms.  You lose weight and you do not know why.  You have trouble swallowing or it hurts to swallow.  You have wheezing or a cough that keeps happening.  You have a hoarse voice.  Your symptoms do not get better with treatment. Get help right away if:  You have sudden pain in your arms, neck, jaw, teeth, or back.  You suddenly feel sweaty, dizzy, or light-headed.  You have chest pain or shortness of breath.  You vomit and the vomit is green, yellow, or black, or it looks like blood or coffee grounds.  You faint.  Your poop (stool) is red, bloody, or black.  You cannot swallow, drink, or eat. These symptoms may represent a serious problem that is an emergency. Do not wait to see if the symptoms will go away. Get medical help right away. Call your local emergency services (911 in the [place] Do not drive yourself to the hospital. Summary  If a person has gastroesophageal reflux disease (GERD), food and stomach acid move back up into the esophagus and cause symptoms or problems such as damage to the esophagus.  Treatment  will depend on how bad your symptoms are.  Follow a diet as told by your doctor.  Take all medicines only as told by your doctor. This information is not intended to replace advice given to you by your health care provider. Make sure you discuss any questions you have with your health care provider. Document Revised: 05/27/2020 Document Reviewed: 05/27/2020 Elsevier Patient Education  [place].

## 2021-03-11 NOTE — Progress Notes (Signed)
I agree with the above note, plan 

## 2021-03-18 ENCOUNTER — Other Ambulatory Visit (HOSPITAL_COMMUNITY): Payer: Self-pay

## 2021-03-26 ENCOUNTER — Other Ambulatory Visit (HOSPITAL_COMMUNITY): Payer: Self-pay

## 2021-03-26 MED FILL — Sodium Fluoride Cream 1.1%: DENTAL | 30 days supply | Qty: 51 | Fill #0 | Status: AC

## 2021-04-01 ENCOUNTER — Ambulatory Visit: Payer: 59 | Admitting: Nurse Practitioner

## 2021-04-04 ENCOUNTER — Other Ambulatory Visit (HOSPITAL_COMMUNITY): Payer: Self-pay

## 2021-04-04 DIAGNOSIS — D259 Leiomyoma of uterus, unspecified: Secondary | ICD-10-CM | POA: Diagnosis not present

## 2021-04-04 DIAGNOSIS — N912 Amenorrhea, unspecified: Secondary | ICD-10-CM | POA: Diagnosis not present

## 2021-04-04 MED FILL — Amlodipine Besylate Tab 5 MG (Base Equivalent): ORAL | 90 days supply | Qty: 90 | Fill #0 | Status: AC

## 2021-04-18 ENCOUNTER — Ambulatory Visit: Payer: Self-pay | Admitting: Cardiology

## 2021-04-20 ENCOUNTER — Other Ambulatory Visit: Payer: Self-pay

## 2021-04-20 ENCOUNTER — Emergency Department (HOSPITAL_BASED_OUTPATIENT_CLINIC_OR_DEPARTMENT_OTHER)
Admission: EM | Admit: 2021-04-20 | Discharge: 2021-04-20 | Disposition: A | Discharge status: Home / Self Care | Payer: 59 | Attending: Emergency Medicine | Admitting: Emergency Medicine

## 2021-04-20 ENCOUNTER — Encounter (HOSPITAL_BASED_OUTPATIENT_CLINIC_OR_DEPARTMENT_OTHER): Payer: Self-pay

## 2021-04-20 DIAGNOSIS — R002 Palpitations: Secondary | ICD-10-CM | POA: Insufficient documentation

## 2021-04-20 DIAGNOSIS — R0981 Nasal congestion: Secondary | ICD-10-CM | POA: Diagnosis present

## 2021-04-20 DIAGNOSIS — U071 COVID-19: Secondary | ICD-10-CM | POA: Insufficient documentation

## 2021-04-20 DIAGNOSIS — Z79899 Other long term (current) drug therapy: Secondary | ICD-10-CM | POA: Diagnosis not present

## 2021-04-20 DIAGNOSIS — I10 Essential (primary) hypertension: Secondary | ICD-10-CM | POA: Diagnosis not present

## 2021-04-20 LAB — BASIC METABOLIC PANEL
Anion gap: 12 (ref 5–15)
BUN: 11 mg/dL (ref 6–20)
CO2: 23 mmol/L (ref 22–32)
Calcium: 9 mg/dL (ref 8.9–10.3)
Chloride: 104 mmol/L (ref 98–111)
Creatinine, Ser: 0.7 mg/dL (ref 0.44–1.00)
GFR, Estimated: >60 mL/min (ref >60)
Glucose, Bld: 75 mg/dL (ref 70–99)
Potassium: 3.2 mmol/L — ABNORMAL LOW (ref 3.5–5.1)
Sodium: 139 mmol/L (ref 135–145)

## 2021-04-20 LAB — RESP PANEL BY RT-PCR (FLU A&B, COVID) ARPGX2
Influenza A by PCR: NEGATIVE
Influenza B by PCR: NEGATIVE
SARS Coronavirus 2 by RT PCR: POSITIVE — AB

## 2021-04-20 MED ORDER — ACETAMINOPHEN ER 650 MG PO TBCR
650.0000 mg | EXTENDED_RELEASE_TABLET | Freq: Three times a day (TID) | ORAL | 0 refills | Status: DC | PRN
Start: 2021-04-20 — End: 2023-07-27

## 2021-04-20 MED ORDER — GUAIFENESIN 100 MG/5ML PO LIQD: 100.0000 mg | ORAL | 0 refills | Status: DC | PRN

## 2021-04-20 MED ORDER — GUAIFENESIN ER 600 MG PO TB12
600.0000 mg | ORAL_TABLET | Freq: Two times a day (BID) | ORAL | Status: DC
  Filled 2021-04-20: qty 1

## 2021-04-20 MED ORDER — NIRMATRELVIR/RITONAVIR (PAXLOVID)TABLET: 3.0000 | ORAL_TABLET | Freq: Two times a day (BID) | ORAL | 0 refills | Status: DC

## 2021-04-20 MED ORDER — NIRMATRELVIR/RITONAVIR (PAXLOVID)TABLET: 3.0000 | ORAL_TABLET | Freq: Two times a day (BID) | ORAL | 0 refills | Status: AC

## 2021-04-20 NOTE — ED Notes (Signed)
CRITICAL VALUE STICKER  CRITICAL VALUE:COVID +  RECEIVER (on-site recipient of call):Shawnie Pons, RN  DATE & TIME NOTIFIED: 04-20-2021 [phone]  MESSENGER (representative from lab):  MD NOTIFIED: Dr. Kathrynn Humble  TIME OF NOTIFICATION:0920  RESPONSE:

## 2021-04-20 NOTE — Discharge Instructions (Addendum)
You are seen in the ER for congestion, fatigue and chills.  You have COVID-19.  Given your full vaccination, we expect you to get better. We have prescribed you oral antiviral, that should further reduce the risk of your symptoms getting worse and requiring admission to the hospital.  Take the medications prescribed. See your primary care doctor in 1 week.  Return to the ER if you start having worsening shortness of breath, fainting, confusion.

## 2021-04-20 NOTE — ED Provider Notes (Signed)
[facility] EMERGENCY DEPT Provider Note   CSN: [id number] Arrival date & time: 04/20/21  0736     History Chief Complaint  Patient presents with  . Nasal Congestion    [name] is a 50 y.o. female.  HPI    50 year old female comes in with chief complaint of nasal congestion. Patient has been having symptoms since Friday.  She initially was having congestion, headaches, cold-like symptoms.  She is now also feeling significantly weak.  No chest pain, shortness of breath.  Yolanda Bonine is in the ER with similar symptoms.  No known sick contacts.  Review of system is negative for fevers, but patient is having chills.  She has no underlying lung disease, cardiac disease.  Past Medical History:  Diagnosis Date  . Abnormal Pap smear   . Anemia   . Endometriosis   . Fibroids   . Hypertension   . Ovarian cyst     Patient Active Problem List   Diagnosis Date Noted  . Dental infection 11/21/2020  . Essential hypertension 08/31/2019  . Well adult exam 05/13/2018  . Iron deficiency anemia 04/03/2016    Past Surgical History:  Procedure Laterality Date  .  diagnostic laparscopic  06/2004  . CHROMOPERTUBATION N/A 11/28/2013   Procedure: CHROMOPERTUBATION, ;  Surgeon: Lovenia Kim, MD;  Location: [facility];  Service: Gynecology;  Laterality: N/A;  . DIAGNOSTIC LAPAROSCOPY  2005   [place]  . DILATATION & CURETTAGE/HYSTEROSCOPY WITH MYOSURE N/A 12/30/2018   Procedure: DILATATION & CURETTAGE/HYSTEROSCOPY WITH MYOSURE;  Surgeon: Brien Few, MD;  Location: [facility];  Service: Gynecology;  Laterality: N/A;  . HYSTEROSCOPY WITH NOVASURE N/A 12/30/2018   Procedure: HYSTEROSCOPY WITH NOVASURE, POSSIBLE HYDROTHERMAL ABLATION;  Surgeon: Brien Few, MD;  Location: [facility];  Service: Gynecology;  Laterality: N/A;  . OVARIAN CYST REMOVAL  2015  . ROBOTIC ASSISTED LAPAROSCOPIC LYSIS OF ADHESION N/A 11/28/2013   Procedure: ROBOTIC ASSISTED LAPAROSCOPIC LYSIS OF  ADHESION; EXCISION OF RIGHT OVARIAN CYST WALL AND MYOMECTOMY;  Surgeon: Lovenia Kim, MD;  Location: [facility];  Service: Gynecology;  Laterality: N/A;  . UTERINE FIBROID SURGERY  2015  . WISDOM TOOTH EXTRACTION       OB History    Gravida  0   Para  0   Term  0   Preterm  0   AB  0   Living  0     SAB  0   IAB  0   Ectopic  0   Multiple  0   Live Births  0           Family History  Problem Relation Age of Onset  . Hypertension Mother   . Stroke Mother   . Hypertension Father   . Heart disease Father   . Stroke Maternal Grandfather   . Hypertension Maternal Grandfather   . Cancer Paternal Grandmother        stomach cancer  . Hyperlipidemia Paternal Grandmother     Social History   Tobacco Use  . Smoking status: Never Smoker  . Smokeless tobacco: Never Used  Vaping Use  . Vaping Use: Never used  Substance Use Topics  . Alcohol use: No  . Drug use: No    Home Medications Prior to Admission medications   Medication Sig Start Date End Date Taking? Authorizing Provider  amLODipine (NORVASC) 5 MG tablet TAKE 1 TABLET BY MOUTH ONCE A DAY 09/19/20 09/19/21  Luetta Nutting, DO  fluticasone (FLONASE) 50 MCG/ACT nasal spray  Place 1-2 sprays into both nostrils daily. 03/06/21   Wieters, Hallie C, PA-C  ipratropium (ATROVENT) 0.06 % nasal spray Use 1-2 sprays in each nostril twice daily as needed for drainage 03/10/21     leuprolide (LUPRON DEPOT, 68-MONTH,) 3.75 MG injection Inject 3.75 mg into the muscle once.    [provider]  norethindrone (AYGESTIN) 5 MG tablet TAKE 1 TABLET BY MOUTH DAILY 01/01/21 01/01/22  Brien Few, MD  omeprazole (PRILOSEC) 40 MG capsule Take 1 capsule (40 mg total) by mouth daily. 03/11/21   Willia Craze, NP  sodium fluoride (PREVIDENT 5000 PLUS) 1.1 % CREA dental cream APPLY A PEA-SIZED AMOUNT OF THE PASTE TO THE TOOTHBRUSH AND BRUSH THOROUGHLY FOR 2 MINUTES, PREFERABLY AT BEDTIME. 02/20/21 02/20/22  Ladell Pier C, DDS     Allergies    Patient has no known allergies.  Review of Systems   Review of Systems  Constitutional: Positive for activity change, chills and fatigue.  HENT: Positive for congestion.   Respiratory: Positive for cough.   Cardiovascular: Negative for chest pain.  Allergic/Immunologic: Negative for immunocompromised state.  All other systems reviewed and are negative.   Physical Exam Updated Vital Signs BP (!) 128/91 (BP Location: Right Arm)   Pulse (!) 122   Temp 98.5 F (36.9 C)   Resp 20   Ht 5' 7"  (1.702 m)   Wt 63.5 kg   SpO2 100%   BMI 21.93 kg/m   Physical Exam Constitutional:      Appearance: She is well-developed.  HENT:     Head: Atraumatic.  Eyes:     Pupils: Pupils are equal, round, and reactive to light.  Cardiovascular:     Rate and Rhythm: Regular rhythm. Tachycardia present.     Heart sounds: Normal heart sounds.  Pulmonary:     Effort: Pulmonary effort is normal. No respiratory distress.  Skin:    General: Skin is warm and dry.  Neurological:     Mental Status: She is alert and oriented to person, place, and time.     ED Results / Procedures / Treatments   Labs (all labs ordered are listed, but only abnormal results are displayed) Labs Reviewed  RESP PANEL BY RT-PCR (FLU A&B, COVID) ARPGX2 - Abnormal; Notable for the following components:      Result Value   SARS Coronavirus 2 by RT PCR POSITIVE (*)    All other components within normal limits    EKG None  Radiology No results found.  Procedures Procedures   Medications Ordered in ED Medications  guaiFENesin (MUCINEX) 12 hr tablet 600 mg (600 mg Oral Not Given 04/20/21 0830)    ED Course  I have reviewed the triage vital signs and the nursing notes.  Pertinent labs & imaging results that were available during my care of the patient were reviewed by me and considered in my medical decision making (see chart for details).    MDM Rules/Calculators/A&P                           [name] was evaluated in Emergency Department on 04/20/2021 for the symptoms described in the history of present illness. She was evaluated in the context of the global COVID-19 pandemic, which necessitated consideration that the patient might be at risk for infection with the SARS-CoV-2 virus that causes COVID-19. Institutional protocols and algorithms that pertain to the evaluation of patients at risk for COVID-19 are in a  state of rapid change based on information released by regulatory bodies including the CDC and federal and state organizations. These policies and algorithms were followed during the patient's care in the ED.   50 year old female comes in a chief complaint of nasal congestion, chills, body aches, fatigue.  High suspicion for COVID-19 or viral URI.  She is fully vaccinated but not boosted. Patient is tachycardic, no hypoxia or respiratory distress.  9:51 AM COVID-19 is positive. Patient is willing to try oral antiviral, will call pharmacy to ensure that they have it in stock before discharging the patient  Final Clinical Impression(s) / ED Diagnoses Final diagnoses:  COVID-19    Rx / DC Orders ED Discharge Orders    None       Varney Biles, MD 04/20/21 [phone]

## 2021-04-20 NOTE — ED Triage Notes (Addendum)
Pt c/o nasal congestion, fatigue, chills. Denies fever. Symptoms x 2 days. Pt has been vaccinated for flu and has had 2 COVID vaccines. Denies sick contacts.

## 2021-04-25 ENCOUNTER — Ambulatory Visit: Payer: 59 | Admitting: Nurse Practitioner

## 2021-06-06 ENCOUNTER — Other Ambulatory Visit (HOSPITAL_COMMUNITY): Payer: Self-pay

## 2021-06-06 MED FILL — Sodium Fluoride Cream 1.1%: DENTAL | 30 days supply | Qty: 51 | Fill #1 | Status: CN

## 2021-06-14 ENCOUNTER — Other Ambulatory Visit (HOSPITAL_COMMUNITY): Payer: Self-pay

## 2021-06-20 ENCOUNTER — Other Ambulatory Visit (HOSPITAL_COMMUNITY): Payer: Self-pay

## 2021-06-20 MED FILL — Sodium Fluoride Cream 1.1%: DENTAL | 30 days supply | Qty: 51 | Fill #1 | Status: AC

## 2021-06-24 ENCOUNTER — Other Ambulatory Visit (HOSPITAL_COMMUNITY): Payer: Self-pay

## 2021-06-24 DIAGNOSIS — M79641 Pain in right hand: Secondary | ICD-10-CM | POA: Diagnosis not present

## 2021-06-24 DIAGNOSIS — R29898 Other symptoms and signs involving the musculoskeletal system: Secondary | ICD-10-CM | POA: Diagnosis not present

## 2021-06-24 DIAGNOSIS — R200 Anesthesia of skin: Secondary | ICD-10-CM | POA: Diagnosis not present

## 2021-06-24 MED ORDER — CARESTART COVID-19 HOME TEST VI KIT
PACK | 0 refills | Status: DC
  Filled 2021-06-24: qty 4, 4d supply, fill #0

## 2021-06-24 MED FILL — Norethindrone Acetate Tab 5 MG: ORAL | 30 days supply | Qty: 30 | Fill #0 | Status: AC

## 2021-06-27 ENCOUNTER — Encounter: Payer: Self-pay | Admitting: Neurology

## 2021-07-16 ENCOUNTER — Other Ambulatory Visit: Payer: Self-pay

## 2021-07-16 DIAGNOSIS — R202 Paresthesia of skin: Secondary | ICD-10-CM

## 2021-07-17 ENCOUNTER — Other Ambulatory Visit: Payer: Self-pay

## 2021-07-17 ENCOUNTER — Ambulatory Visit: Payer: 59 | Admitting: Neurology

## 2021-07-17 DIAGNOSIS — R202 Paresthesia of skin: Secondary | ICD-10-CM | POA: Diagnosis not present

## 2021-07-17 NOTE — Procedures (Signed)
[facility]  [address]  [place], [place] [address] Tel: [phone] Fax:  [phone] Test Date:  07/17/2021  Patient: [name] DOB: 1971-01-21 Physician: Narda Amber, DO  Sex: Female Height: 5' 7"  Ref Phys: Roseanne Kaufman  ID#: [id number]   Technician:    Patient Complaints: This is a 50 year old female referred for evaluation of right hand paresthesias.  NCV & EMG Findings: Extensive electrodiagnostic testing of the right upper extremity and additional studies of the left shows:  Bilateral median, ulnar, and mixed palmar sensory responses are within normal limits. Bilateral median and ulnar motor responses are within normal limits. There is no evidence of active or chronic motor axonal loss changes affecting any of the tested muscles.  Motor unit configuration and recruitment pattern is within normal limits.  Impression: This is a normal study of the upper extremities.  In particular, there is no evidence of carpal tunnel syndrome or a cervical radiculopathy.    ___________________________ Narda Amber, DO    Nerve Conduction Studies Anti Sensory Summary Table   Stim Site NR Peak (ms) Norm Peak (ms) P-T Amp (V) Norm P-T Amp  Left Median Anti Sensory (2nd Digit)  33C  Wrist    3.1 <3.6 55.7 >15  Right Median Anti Sensory (2nd Digit)  Wrist    3.0 <3.6 49.0 >15  Left Ulnar Anti Sensory (5th Digit)  33C  Wrist    3.1 <3.1 47.9 >10  Right Ulnar Anti Sensory (5th Digit)  33C  Wrist    3.0 <3.1 50.5 >10   Motor Summary Table   Stim Site NR Onset (ms) Norm Onset (ms) O-P Amp (mV) Norm O-P Amp Site1 Site2 Delta-0 (ms) Dist (cm) Vel (m/s) Norm Vel (m/s)  Left Median Motor (Abd Poll Brev)  33C  Wrist    2.5 <4.0 8.0 >6 Elbow Wrist 4.8 29.0 60 >50  Elbow    7.3  7.7         Right Median Motor (Abd Poll Brev)  33C  Wrist    2.6 <4.0 7.7 >6 Elbow Wrist 4.6 30.0 65 >50  Elbow    7.2  6.8         Left Ulnar Motor (Abd Dig Minimi)   33C  Wrist    3.1 <3.1 7.1 >7 B Elbow Wrist 3.6 23.0 64 >50  B Elbow    6.7  6.0  A Elbow B Elbow 1.9 10.0 53 >50  A Elbow    8.6  5.7         Right Ulnar Motor (Abd Dig Minimi)  33C  Wrist    2.4 <3.1 7.1 >7 B Elbow Wrist 4.0 22.0 55 >50  B Elbow    6.4  6.2  A Elbow B Elbow 1.8 10.0 56 >50  A Elbow    8.2  5.6          Comparison Summary Table   Stim Site NR Peak (ms) Norm Peak (ms) P-T Amp (V) Site1 Site2 Delta-P (ms) Norm Delta (ms)  Left Median/Ulnar Palm Comparison (Wrist - 8cm)  33C  Median Palm    1.6 <2.2 81.0 Median Palm Ulnar Palm 0.0   Ulnar Palm    1.6 <2.2 20.3      Right Median/Ulnar Palm Comparison (Wrist - 8cm)  33C  Median Palm    1.8 <2.2 69.0 Median Palm Ulnar Palm 0.0   Ulnar Palm    1.8 <2.2 12.1  EMG   Side Muscle Ins Act Fibs Psw Fasc Number Recrt Dur Dur. Amp Amp. Poly Poly. Comment  Right 1stDorInt Nml Nml Nml Nml Nml Nml Nml Nml Nml Nml Nml Nml N/A  Right PronatorTeres Nml Nml Nml Nml Nml Nml Nml Nml Nml Nml Nml Nml N/A  Right Biceps Nml Nml Nml Nml Nml Nml Nml Nml Nml Nml Nml Nml N/A  Right Triceps Nml Nml Nml Nml Nml Nml Nml Nml Nml Nml Nml Nml N/A  Right Deltoid Nml Nml Nml Nml Nml Nml Nml Nml Nml Nml Nml Nml N/A      Waveforms:

## 2021-07-28 DIAGNOSIS — M79641 Pain in right hand: Secondary | ICD-10-CM | POA: Diagnosis not present

## 2021-07-30 ENCOUNTER — Encounter: Payer: 59 | Admitting: Neurology

## 2021-08-25 IMAGING — DX DG CHEST 2V
2 series · 2 of 2 positions shown · non-contrast
Comparison: None.

CLINICAL DATA: 49-year-old female with a history of chest pain

EXAM:
CHEST - 2 VIEW

[chest pa]
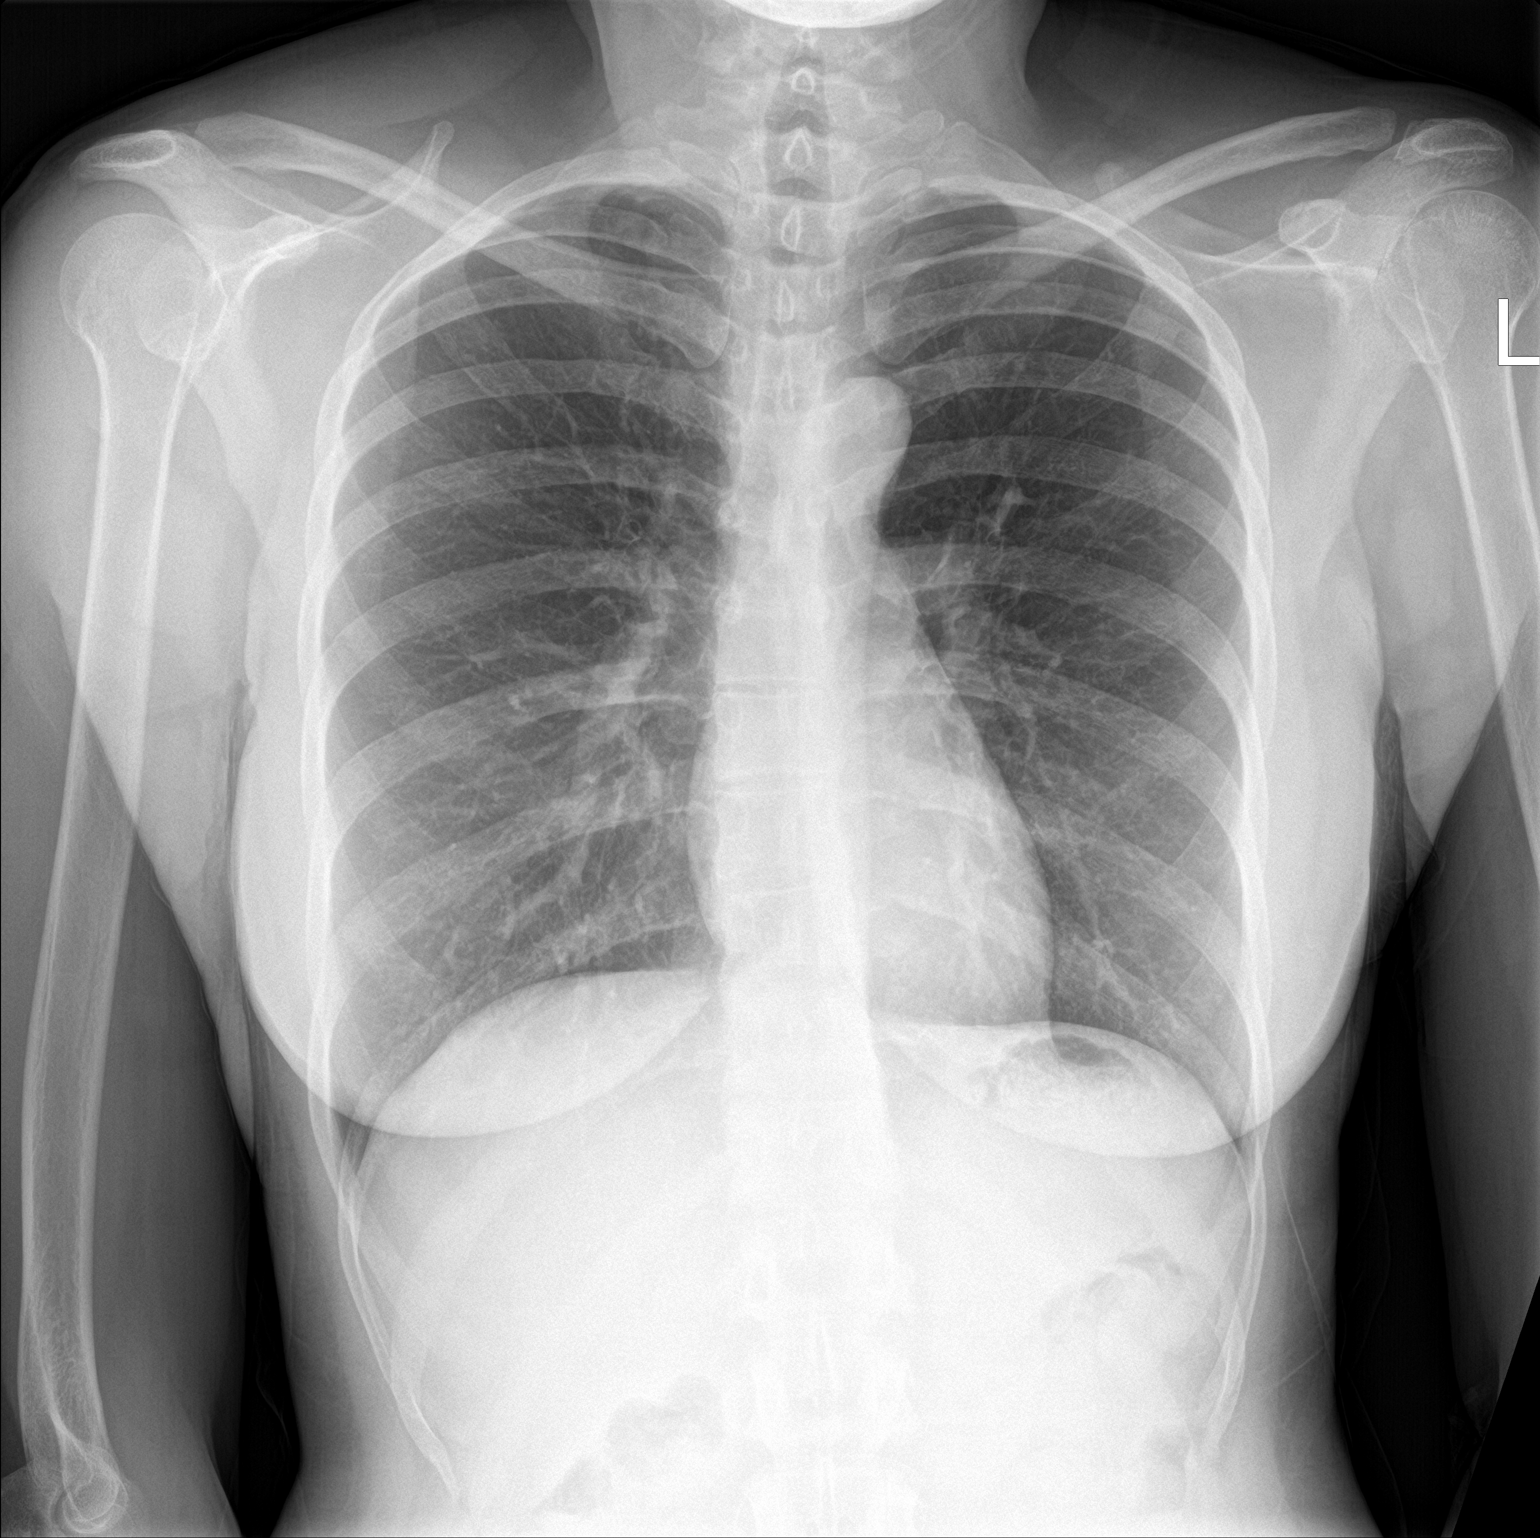

[chest lat]
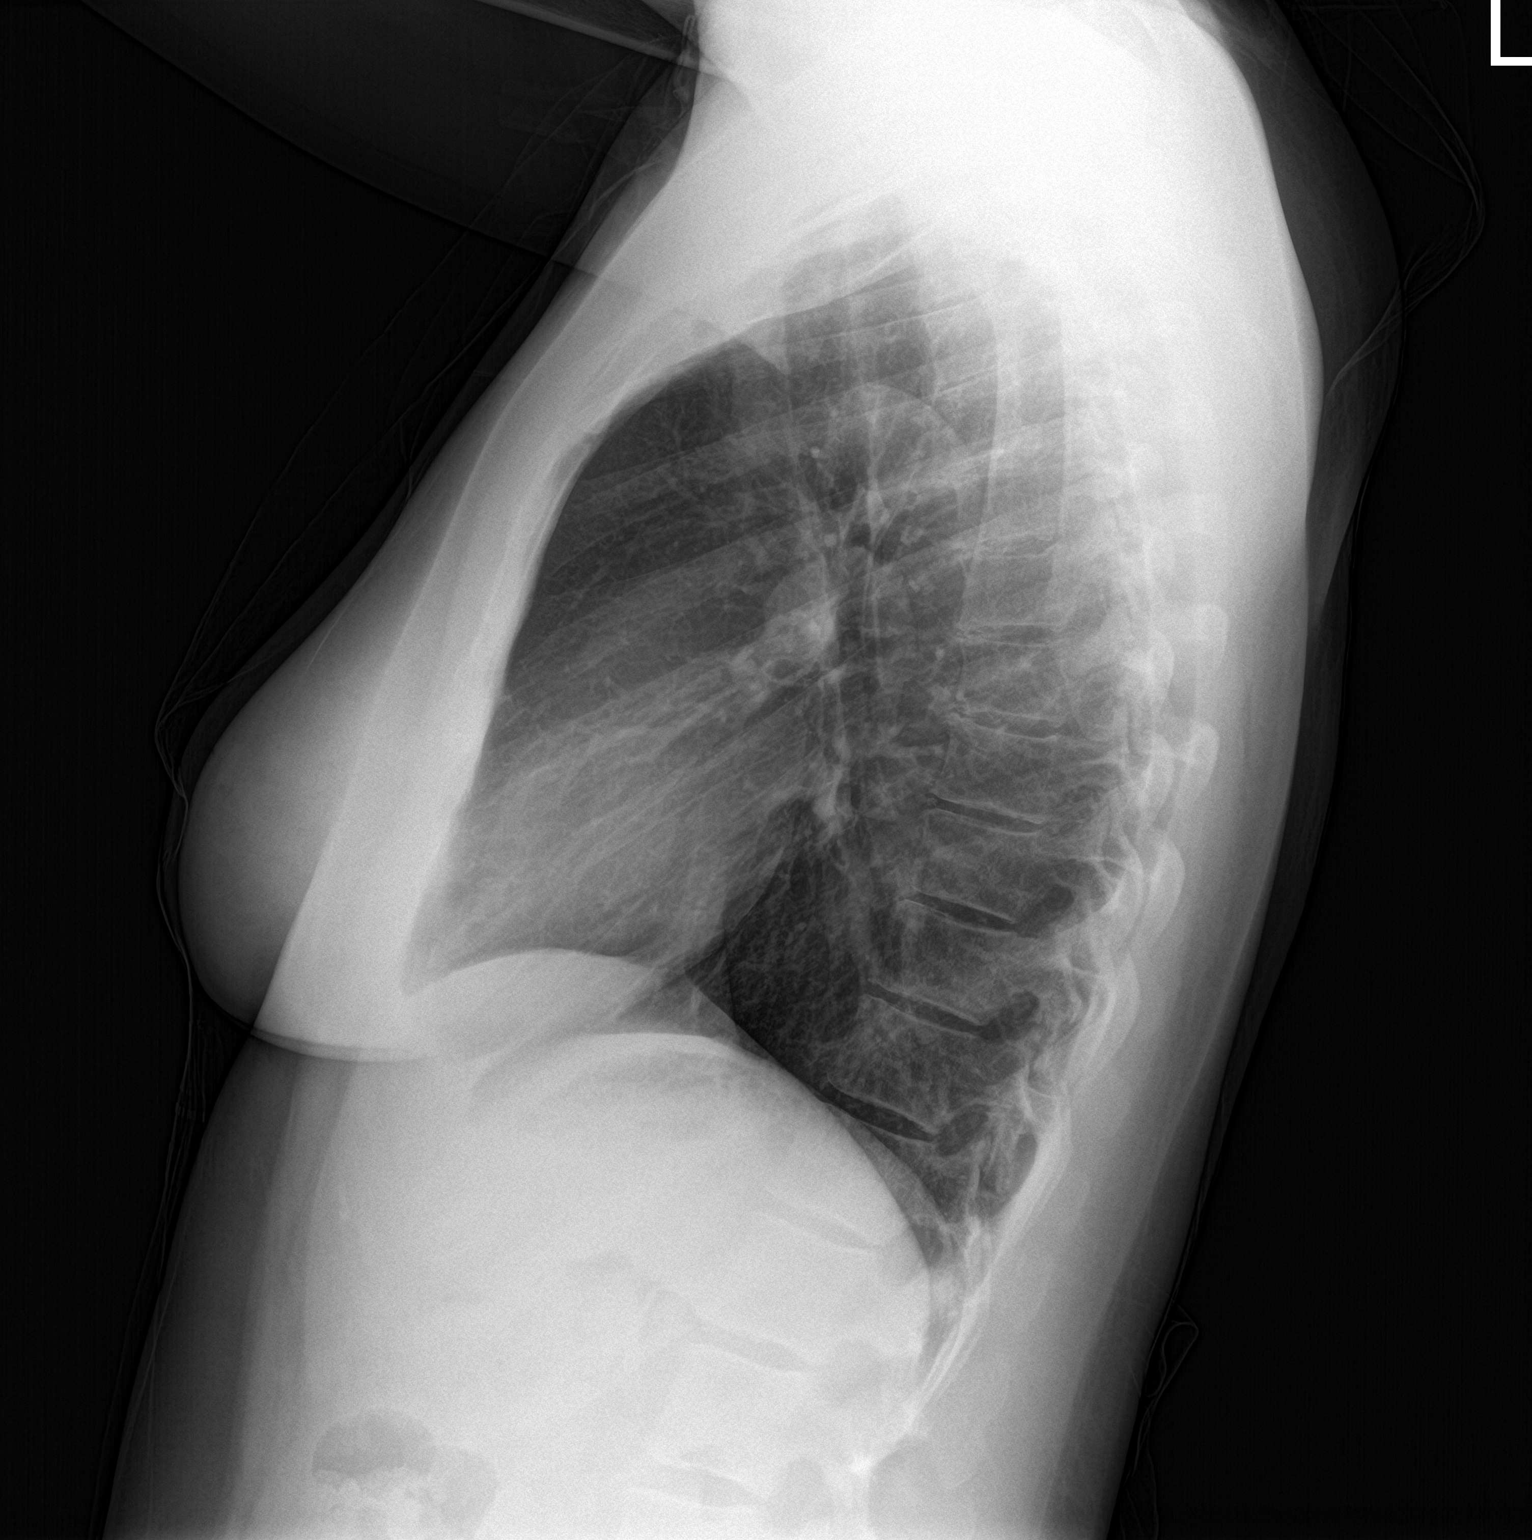

[2 of 2 positions shown; findings below may reference images not displayed]

FINDINGS: The heart size and mediastinal contours are within normal limits.
Both lungs are clear. The visualized skeletal structures are
unremarkable.
IMPRESSION: No active cardiopulmonary disease.

## 2021-10-07 ENCOUNTER — Other Ambulatory Visit (HOSPITAL_COMMUNITY): Payer: Self-pay

## 2021-10-07 ENCOUNTER — Other Ambulatory Visit: Payer: Self-pay | Admitting: Family Medicine

## 2021-10-07 DIAGNOSIS — I10 Essential (primary) hypertension: Secondary | ICD-10-CM

## 2021-10-07 MED FILL — Amlodipine Besylate Tab 5 MG (Base Equivalent): ORAL | 90 days supply | Qty: 90 | Fill #0 | Status: CN

## 2021-10-10 ENCOUNTER — Other Ambulatory Visit (HOSPITAL_COMMUNITY): Payer: Self-pay

## 2021-10-13 ENCOUNTER — Other Ambulatory Visit (HOSPITAL_COMMUNITY): Payer: Self-pay

## 2021-10-15 ENCOUNTER — Other Ambulatory Visit (HOSPITAL_COMMUNITY): Payer: Self-pay

## 2021-10-18 ENCOUNTER — Other Ambulatory Visit (HOSPITAL_COMMUNITY): Payer: Self-pay

## 2021-10-18 MED ORDER — CARESTART COVID-19 HOME TEST VI KIT
PACK | 0 refills | Status: DC
  Filled 2021-10-18: qty 4, 28d supply, fill #0

## 2021-10-18 MED FILL — Amlodipine Besylate Tab 5 MG (Base Equivalent): ORAL | 90 days supply | Qty: 90 | Fill #0 | Status: AC

## 2021-10-21 ENCOUNTER — Other Ambulatory Visit (HOSPITAL_COMMUNITY): Payer: Self-pay

## 2021-11-28 ENCOUNTER — Telehealth (INDEPENDENT_AMBULATORY_CARE_PROVIDER_SITE_OTHER): Payer: 59 | Admitting: Family Medicine

## 2021-11-28 ENCOUNTER — Encounter: Payer: Self-pay | Admitting: Family Medicine

## 2021-11-28 DIAGNOSIS — H1012 Acute atopic conjunctivitis, left eye: Secondary | ICD-10-CM

## 2021-11-28 MED ORDER — OLOPATADINE HCL 0.1 % OP SOLN: 1.0000 [drp] | Freq: Two times a day (BID) | OPHTHALMIC | 0 refills | Status: DC

## 2021-11-28 NOTE — Progress Notes (Signed)
Called pt lvm informing her that I was calling to do her prescreening. Asked that she log into her mychart

## 2021-11-28 NOTE — Progress Notes (Signed)
Virtual Video Visit via MyChart Note  I connected with  [name] on 11/28/21 at 10:10 AM EST by the video enabled telemedicine application for MyChart, and verified that I am speaking with the correct person using two identifiers.   I introduced myself as a Designer, jewellery with the practice. We discussed the limitations of evaluation and management by telemedicine and the availability of in person appointments. The patient expressed understanding and agreed to proceed.  Participating parties in this visit include: The patient and the nurse practitioner listed.  The patient is: At home I am: In the office - [facility]  Subjective:    CC:  Chief Complaint  Patient presents with   Eye Problem    HPI: [name] is a 50 y.o. year old female presenting today via [place] today for left eye irritation.  Patient states that few days ago her left eye started swelling (primarily lower lid) and was feeling itchy/irritated. She thought she might be developing a stye, but never saw anything come up. She has tried her best not to mess with the eye. When she woke up yesterday she thought the eye looked even more swollen, but did improve somewhat after taking benadryl. She is not having any pain and has not noticed any redness. She did notice some mild crusting when she woke up in the morning, but none throughout the rest of the day. She denies any vision changes, pain, conjunctivitis, redness, etc. States she does tend to get allergies flared up when she is in [place] - she has been there this week visiting family for Christmas.     Past medical history, Surgical history, Family history not pertinant except as noted below, Social history, Allergies, and medications have been entered into the medical record, reviewed, and corrections made.   Review of Systems:  All review of systems negative except what is listed in the HPI   Objective:    General:  Speaking  clearly in complete sentences. Absent shortness of breath noted.   Alert and oriented x3.   Normal judgment.  Absent acute distress. Left lower eye lid appears slightly puffy, but difficult to visualize due to video quality.   Impression and Recommendations:    1. Allergic conjunctivitis of left eye - olopatadine (PATADAY) 0.1 % ophthalmic solution; Place 1 drop into both eyes 2 (two) times daily.  Dispense: 5 mL; Refill: 0  Not sounding like a bacterial infection. Recommend she continue good hand hygiene and warm compresses occasionally to eye. Sending in some pataday drops to see if she gets any relief with this. Patient aware of signs/symptoms requiring further/urgent evaluation.     Follow-up if symptoms worsen or fail to improve.    I discussed the assessment and treatment plan with the patient. The patient was provided an opportunity to ask questions and all were answered. The patient agreed with the plan and demonstrated an understanding of the instructions.   The patient was advised to call back or seek an in-person evaluation if the symptoms worsen or if the condition fails to improve as anticipated.  I spent 20 minutes dedicated to the care of this patient on the date of this encounter to include pre-visit chart review of prior notes and results, face-to-face time with the patient, and post-visit ordering of testing as indicated.   Terrilyn Saver, NP

## 2022-01-06 ENCOUNTER — Other Ambulatory Visit (HOSPITAL_COMMUNITY): Payer: Self-pay

## 2022-01-06 ENCOUNTER — Telehealth: Payer: Self-pay

## 2022-01-06 ENCOUNTER — Other Ambulatory Visit: Payer: Self-pay | Admitting: Family Medicine

## 2022-01-06 MED ORDER — NIRMATRELVIR/RITONAVIR (PAXLOVID)TABLET: 3.0000 | ORAL_TABLET | Freq: Two times a day (BID) | ORAL | 0 refills | Status: AC

## 2022-01-06 MED ORDER — NIRMATRELVIR/RITONAVIR (PAXLOVID)TABLET
3.0000 | ORAL_TABLET | Freq: Two times a day (BID) | ORAL | 0 refills | Status: DC
  Filled 2022-01-06: qty 30, 5d supply, fill #0

## 2022-01-06 NOTE — Telephone Encounter (Signed)
If she is not feeling too bad she can do supportive care at home with increased fluids and OTC medications for symptoms.  If symptoms are worsening we can add antiviral medication.

## 2022-01-06 NOTE — Telephone Encounter (Addendum)
Symptoms started on Monday morning. Current sx's: cough, nasal and chest congestion. Taking Mucinex. Symptoms are worsening but sore throat feels better.  Pharmacy: [facility] on [facility] [address] [facility].

## 2022-01-06 NOTE — Telephone Encounter (Signed)
Paxlovid sent in

## 2022-01-06 NOTE — Telephone Encounter (Signed)
Pt lvm stating COVID + since yesterday. Requested medication if necessary.  Returned pt's call. No answer. LVM requesting callback with start date of symptoms and types of symptoms. Callback information provided.

## 2022-03-04 ENCOUNTER — Other Ambulatory Visit (HOSPITAL_COMMUNITY): Payer: Self-pay

## 2022-03-04 MED FILL — Amlodipine Besylate Tab 5 MG (Base Equivalent): ORAL | 90 days supply | Qty: 90 | Fill #1 | Status: AC

## 2022-07-10 DIAGNOSIS — Z6824 Body mass index (BMI) 24.0-24.9, adult: Secondary | ICD-10-CM | POA: Diagnosis not present

## 2022-07-10 DIAGNOSIS — R87610 Atypical squamous cells of undetermined significance on cytologic smear of cervix (ASC-US): Secondary | ICD-10-CM | POA: Diagnosis not present

## 2022-07-10 DIAGNOSIS — Z01419 Encounter for gynecological examination (general) (routine) without abnormal findings: Secondary | ICD-10-CM | POA: Diagnosis not present

## 2022-07-10 DIAGNOSIS — Z1231 Encounter for screening mammogram for malignant neoplasm of breast: Secondary | ICD-10-CM | POA: Diagnosis not present

## 2022-07-10 LAB — HM MAMMOGRAPHY

## 2022-07-14 ENCOUNTER — Other Ambulatory Visit (HOSPITAL_COMMUNITY): Payer: Self-pay

## 2022-07-14 ENCOUNTER — Other Ambulatory Visit: Payer: Self-pay | Admitting: Family Medicine

## 2022-07-14 ENCOUNTER — Telehealth: Payer: Self-pay | Admitting: Family Medicine

## 2022-07-14 DIAGNOSIS — I10 Essential (primary) hypertension: Secondary | ICD-10-CM

## 2022-07-14 MED ORDER — AMLODIPINE BESYLATE 5 MG PO TABS
ORAL_TABLET | Freq: Every day | ORAL | 1 refills | Status: DC
  Filled 2022-07-14: qty 90, 90d supply, fill #0
  Filled 2022-11-13: qty 90, 90d supply, fill #1

## 2022-07-14 NOTE — Telephone Encounter (Signed)
Pt has appointment on 07/22/22 but will be out of BP Meds before then please call into pharmacy

## 2022-07-14 NOTE — Telephone Encounter (Signed)
Completed.

## 2022-07-15 ENCOUNTER — Other Ambulatory Visit (HOSPITAL_COMMUNITY): Payer: Self-pay

## 2022-07-22 ENCOUNTER — Ambulatory Visit (INDEPENDENT_AMBULATORY_CARE_PROVIDER_SITE_OTHER): Payer: 59 | Admitting: Family Medicine

## 2022-07-22 ENCOUNTER — Encounter: Payer: Self-pay | Admitting: Family Medicine

## 2022-07-22 VITALS — BP 114/73 | HR 90 | Ht 67.0 in | Wt 153.0 lb

## 2022-07-22 DIAGNOSIS — I10 Essential (primary) hypertension: Secondary | ICD-10-CM | POA: Diagnosis not present

## 2022-07-22 DIAGNOSIS — Z0000 Encounter for general adult medical examination without abnormal findings: Secondary | ICD-10-CM | POA: Diagnosis not present

## 2022-07-22 DIAGNOSIS — Z13220 Encounter for screening for lipoid disorders: Secondary | ICD-10-CM

## 2022-07-22 DIAGNOSIS — D509 Iron deficiency anemia, unspecified: Secondary | ICD-10-CM

## 2022-07-22 DIAGNOSIS — Z23 Encounter for immunization: Secondary | ICD-10-CM

## 2022-07-22 NOTE — Patient Instructions (Signed)
Preventive Care 51-51 Years Old, Female Preventive care refers to lifestyle choices and visits with your health care provider that can promote health and wellness. Preventive care visits are also called wellness exams. What can I expect for my preventive care visit? Counseling Your health care provider may ask you questions about your: Medical history, including: Past medical problems. Family medical history. Pregnancy history. Current health, including: Menstrual cycle. Method of birth control. Emotional well-being. Home life and relationship well-being. Sexual activity and sexual health. Lifestyle, including: Alcohol, nicotine or tobacco, and drug use. Access to firearms. Diet, exercise, and sleep habits. Work and work environment. Sunscreen use. Safety issues such as seatbelt and bike helmet use. Physical exam Your health care provider will check your: Height and weight. These may be used to calculate your BMI (body mass index). BMI is a measurement that tells if you are at a healthy weight. Waist circumference. This measures the distance around your waistline. This measurement also tells if you are at a healthy weight and may help predict your risk of certain diseases, such as type 2 diabetes and high blood pressure. Heart rate and blood pressure. Body temperature. Skin for abnormal spots. What immunizations do I need?  Vaccines are usually given at various ages, according to a schedule. Your health care provider will recommend vaccines for you based on your age, medical history, and lifestyle or other factors, such as travel or where you work. What tests do I need? Screening Your health care provider may recommend screening tests for certain conditions. This may include: Lipid and cholesterol levels. Diabetes screening. This is done by checking your blood sugar (glucose) after you have not eaten for a while (fasting). Pelvic exam and Pap test. Hepatitis B test. Hepatitis C  test. HIV (human immunodeficiency virus) test. STI (sexually transmitted infection) testing, if you are at risk. Lung cancer screening. Colorectal cancer screening. Mammogram. Talk with your health care provider about when you should start having regular mammograms. This may depend on whether you have a family history of breast cancer. BRCA-related cancer screening. This may be done if you have a family history of breast, ovarian, tubal, or peritoneal cancers. Bone density scan. This is done to screen for osteoporosis. Talk with your health care provider about your test results, treatment options, and if necessary, the need for more tests. Follow these instructions at home: Eating and drinking  Eat a diet that includes fresh fruits and vegetables, whole grains, lean protein, and low-fat dairy products. Take vitamin and mineral supplements as recommended by your health care provider. Do not drink alcohol if: Your health care provider tells you not to drink. You are pregnant, may be pregnant, or are planning to become pregnant. If you drink alcohol: Limit how much you have to 0-1 drink a day. Know how much alcohol is in your drink. In the [place] one drink equals one 12 oz bottle of beer (355 mL), one 5 oz glass of wine (148 mL), or one 1 oz glass of hard liquor (44 mL). Lifestyle Brush your teeth every morning and night with fluoride toothpaste. Floss one time each day. Exercise for at least 30 minutes 5 or more days each week. Do not use any products that contain nicotine or tobacco. These products include cigarettes, chewing tobacco, and vaping devices, such as e-cigarettes. If you need help quitting, ask your health care provider. Do not use drugs. If you are sexually active, practice safe sex. Use a condom or other form of protection to   prevent STIs. If you do not wish to become pregnant, use a form of birth control. If you plan to become pregnant, see your health care provider for a  prepregnancy visit. Take aspirin only as told by your health care provider. Make sure that you understand how much to take and what form to take. Work with your health care provider to find out whether it is safe and beneficial for you to take aspirin daily. Find healthy ways to manage stress, such as: Meditation, yoga, or listening to music. Journaling. Talking to a trusted person. Spending time with friends and family. Minimize exposure to UV radiation to reduce your risk of skin cancer. Safety Always wear your seat belt while driving or riding in a vehicle. Do not drive: If you have been drinking alcohol. Do not ride with someone who has been drinking. When you are tired or distracted. While texting. If you have been using any mind-altering substances or drugs. Wear a helmet and other protective equipment during sports activities. If you have firearms in your house, make sure you follow all gun safety procedures. Seek help if you have been physically or sexually abused. What's next? Visit your health care provider once a year for an annual wellness visit. Ask your health care provider how often you should have your eyes and teeth checked. Stay up to date on all vaccines. This information is not intended to replace advice given to you by your health care provider. Make sure you discuss any questions you have with your health care provider. Document Revised: 05/14/2021 Document Reviewed: 05/14/2021 Elsevier Patient Education  [place].

## 2022-07-22 NOTE — Assessment & Plan Note (Signed)
Well adult Orders Placed This Encounter  Procedures  . Zoster Recombinant (Shingrix )  . COMPLETE METABOLIC PANEL WITH GFR  . CBC with Differential  . Lipid Panel w/reflex Direct LDL  . Iron, TIBC and Ferritin Panel  Screenings: Per lab orders.  We will request copies of her last Pap and Cologuard from [facility]. Immunizations: Shingrix No. 1 given today Anticipatory guidance/risk factor reduction: Recommendations per AVS.

## 2022-07-22 NOTE — Progress Notes (Signed)
[name] - 51 y.o. female MRN [id number]  Date of birth: [birth year]  Subjective Chief Complaint  Patient presents with   Annual Exam    HPI [name] is a 51 y.o. female here today for annual exam.   She reports she is doing well at this time.  She does not have any new complaints or concerns at this time.  She does try to exercise occasionally.  Feels like diet is pretty good.  She is a non-smoker.  Denies alcohol intake at this time.  She did have Cologuard and Pap completed through her GYN.  She would like to have Shingrix  Review of Systems  Constitutional:  Negative for chills, fever, malaise/fatigue and weight loss.  HENT:  Negative for congestion, ear pain and sore throat.   Eyes:  Negative for blurred vision, double vision and pain.  Respiratory:  Negative for cough and shortness of breath.   Cardiovascular:  Negative for chest pain and palpitations.  Gastrointestinal:  Negative for abdominal pain, blood in stool, constipation, heartburn and nausea.  Genitourinary:  Negative for dysuria and urgency.  Musculoskeletal:  Negative for joint pain and myalgias.  Neurological:  Negative for dizziness and headaches.  Endo/Heme/Allergies:  Does not bruise/bleed easily.  Psychiatric/Behavioral:  Negative for depression. The patient is not nervous/anxious and does not have insomnia.    No Known Allergies  Past Medical History:  Diagnosis Date   Abnormal Pap smear    Anemia    Endometriosis    Fibroids    Hypertension    Ovarian cyst     Past Surgical History:  Procedure Laterality Date    diagnostic laparscopic  06/2004   CHROMOPERTUBATION N/A 11/28/2013   Procedure: CHROMOPERTUBATION, ;  Surgeon: Lovenia Kim, MD;  Location: [facility];  Service: Gynecology;  Laterality: N/A;   DIAGNOSTIC LAPAROSCOPY  2005   [place] N/A 12/30/2018   Procedure: DILATATION & CURETTAGE/HYSTEROSCOPY WITH MYOSURE;   Surgeon: Brien Few, MD;  Location: [facility];  Service: Gynecology;  Laterality: N/A;   HYSTEROSCOPY WITH NOVASURE N/A 12/30/2018   Procedure: HYSTEROSCOPY WITH NOVASURE, POSSIBLE HYDROTHERMAL ABLATION;  Surgeon: Brien Few, MD;  Location: [facility];  Service: Gynecology;  Laterality: N/A;   OVARIAN CYST REMOVAL  2015   ROBOTIC ASSISTED LAPAROSCOPIC LYSIS OF ADHESION N/A 11/28/2013   Procedure: ROBOTIC ASSISTED LAPAROSCOPIC LYSIS OF ADHESION; EXCISION OF RIGHT OVARIAN CYST WALL AND MYOMECTOMY;  Surgeon: Lovenia Kim, MD;  Location: [facility];  Service: Gynecology;  Laterality: N/A;   UTERINE FIBROID SURGERY  2015   WISDOM TOOTH EXTRACTION      Social History   Socioeconomic History   Marital status: Married    Spouse name: Not on file   Number of children: Not on file   Years of education: Not on file   Highest education level: Not on file  Occupational History   Not on file  Tobacco Use   Smoking status: Never   Smokeless tobacco: Never  Vaping Use   Vaping Use: Never used  Substance and Sexual Activity   Alcohol use: No   Drug use: No   Sexual activity: Yes    Birth control/protection: Injection  Other Topics Concern   Not on file  Social History Narrative   Not on file   Social Determinants of Health   Financial Resource Strain: Not on file  Food Insecurity: Not on file  Transportation Needs: Not on file  Physical Activity:  Not on file  Stress: Not on file  Social Connections: Not on file    Family History  Problem Relation Age of Onset   Hypertension Mother    Stroke Mother    Hypertension Father    Heart disease Father    Stroke Maternal Grandfather    Hypertension Maternal Grandfather    Cancer Paternal Grandmother        stomach cancer   Hyperlipidemia Paternal Grandmother     Health Maintenance  Topic Date Due   HIV Screening  Never done   Hepatitis C Screening  Never done   COLONOSCOPY (Pts 45-44yr Insurance coverage will need to be  confirmed)  Never done   COVID-19 Vaccine (3 - Pfizer series) 04/05/2020   PAP SMEAR-Modifier  09/12/2021   INFLUENZA VACCINE  02/28/2023 (Originally 06/30/2022)   Zoster Vaccines- Shingrix (2 of 2) 09/16/2022   MAMMOGRAM  10/02/2022   TETANUS/TDAP  06/30/2026   HPV VACCINES  Aged Out     ----------------------------------------------------------------------------------------------------------------------------------------------------------------------------------------------------------------- Physical Exam BP 114/73 (BP Location: Left Arm, Patient Position: Sitting, Cuff Size: Normal)   Pulse 90   Ht '5\' 7"'$  (1.702 m)   Wt 153 lb (69.4 kg)   SpO2 100%   BMI 23.96 kg/m   Physical Exam Constitutional:      General: She is not in acute distress. HENT:     Head: Normocephalic and atraumatic.     Right Ear: Tympanic membrane and ear canal normal.     Left Ear: Tympanic membrane and ear canal normal.     Nose: Nose normal.  Eyes:     General: No scleral icterus.    Conjunctiva/sclera: Conjunctivae normal.  Neck:     Thyroid: No thyromegaly.  Cardiovascular:     Rate and Rhythm: Normal rate and regular rhythm.     Heart sounds: Normal heart sounds.  Pulmonary:     Effort: Pulmonary effort is normal.     Breath sounds: Normal breath sounds.  Abdominal:     General: Bowel sounds are normal. There is no distension.     Palpations: Abdomen is soft.     Tenderness: There is no abdominal tenderness. There is no guarding.  Musculoskeletal:        General: Normal range of motion.     Cervical back: Normal range of motion and neck supple.  Lymphadenopathy:     Cervical: No cervical adenopathy.  Skin:    General: Skin is warm and dry.     Findings: No rash.  Neurological:     General: No focal deficit present.     Mental Status: She is alert and oriented to person, place, and time.     Cranial Nerves: No cranial nerve deficit.     Coordination: Coordination normal.  Psychiatric:         Mood and Affect: Mood normal.        Behavior: Behavior normal.     ------------------------------------------------------------------------------------------------------------------------------------------------------------------------------------------------------------------- Assessment and Plan  Well adult exam Well adult Orders Placed This Encounter  Procedures   Zoster Recombinant (Shingrix )   COMPLETE METABOLIC PANEL WITH GFR   CBC with Differential   Lipid Panel w/reflex Direct LDL   Iron, TIBC and Ferritin Panel  Screenings: Per lab orders.  We will request copies of her last Pap and Cologuard from [facility] Immunizations: Shingrix No. 1 given today Anticipatory guidance/risk factor reduction: Recommendations per AVS.   No orders of the defined types were placed in this encounter.   Return in  about 1 year (around 07/23/2023) for annual exam.    This visit occurred during the SARS-CoV-2 public health emergency.  Safety protocols were in place, including screening questions prior to the visit, additional usage of staff PPE, and extensive cleaning of exam room while observing appropriate contact time as indicated for disinfecting solutions.

## 2022-08-28 ENCOUNTER — Encounter: Payer: Self-pay | Admitting: Family Medicine

## 2022-11-13 ENCOUNTER — Other Ambulatory Visit (HOSPITAL_COMMUNITY): Payer: Self-pay

## 2022-11-19 ENCOUNTER — Other Ambulatory Visit (HOSPITAL_COMMUNITY): Payer: Self-pay

## 2022-11-19 DIAGNOSIS — H35013 Changes in retinal vascular appearance, bilateral: Secondary | ICD-10-CM | POA: Diagnosis not present

## 2022-11-19 DIAGNOSIS — H11133 Conjunctival pigmentations, bilateral: Secondary | ICD-10-CM | POA: Diagnosis not present

## 2022-11-19 DIAGNOSIS — H524 Presbyopia: Secondary | ICD-10-CM | POA: Diagnosis not present

## 2022-12-08 ENCOUNTER — Other Ambulatory Visit (HOSPITAL_COMMUNITY): Payer: Self-pay

## 2022-12-08 MED ORDER — TRANEXAMIC ACID 650 MG PO TABS
1300.0000 mg | ORAL_TABLET | Freq: Three times a day (TID) | ORAL | 7 refills | Status: DC
  Filled 2022-12-08: qty 30, 5d supply, fill #0
  Filled 2022-12-17: qty 30, 5d supply, fill #1

## 2022-12-17 ENCOUNTER — Other Ambulatory Visit (HOSPITAL_COMMUNITY): Payer: Self-pay

## 2022-12-17 MED ORDER — NORETHINDRONE ACETATE 5 MG PO TABS
ORAL_TABLET | ORAL | 0 refills | Status: DC
  Filled 2022-12-17: qty 30, 10d supply, fill #0

## 2022-12-21 ENCOUNTER — Other Ambulatory Visit (HOSPITAL_COMMUNITY): Payer: Self-pay

## 2022-12-21 MED ORDER — LUPRON DEPOT (1-MONTH) 3.75 MG IM KIT
PACK | INTRAMUSCULAR | 6 refills | Status: DC
  Filled 2022-12-21 – 2023-05-10 (×2): qty 1, 30d supply, fill #0
  Filled 2023-06-05: qty 1, 30d supply, fill #1
  Filled 2023-07-05: qty 1, 30d supply, fill #2

## 2022-12-24 ENCOUNTER — Encounter: Payer: Commercial Managed Care - PPO | Admitting: Obstetrics and Gynecology

## 2022-12-24 ENCOUNTER — Other Ambulatory Visit (HOSPITAL_COMMUNITY): Payer: Self-pay

## 2022-12-30 ENCOUNTER — Other Ambulatory Visit (HOSPITAL_COMMUNITY): Payer: Self-pay

## 2023-01-01 DIAGNOSIS — N920 Excessive and frequent menstruation with regular cycle: Secondary | ICD-10-CM | POA: Diagnosis not present

## 2023-01-05 ENCOUNTER — Other Ambulatory Visit (HOSPITAL_COMMUNITY): Payer: Self-pay

## 2023-01-15 ENCOUNTER — Ambulatory Visit
Admission: EM | Admit: 2023-01-15 | Discharge: 2023-01-15 | Disposition: A | Discharge status: Home / Self Care | Payer: Commercial Managed Care - PPO | Attending: Internal Medicine | Admitting: Internal Medicine

## 2023-01-15 ENCOUNTER — Ambulatory Visit (INDEPENDENT_AMBULATORY_CARE_PROVIDER_SITE_OTHER): Payer: Commercial Managed Care - PPO

## 2023-01-15 DIAGNOSIS — M25571 Pain in right ankle and joints of right foot: Secondary | ICD-10-CM

## 2023-01-15 DIAGNOSIS — Z043 Encounter for examination and observation following other accident: Secondary | ICD-10-CM | POA: Diagnosis not present

## 2023-01-15 DIAGNOSIS — W19XXXA Unspecified fall, initial encounter: Secondary | ICD-10-CM | POA: Diagnosis not present

## 2023-01-15 DIAGNOSIS — S92144A Nondisplaced dome fracture of right talus, initial encounter for closed fracture: Secondary | ICD-10-CM | POA: Diagnosis not present

## 2023-01-15 MED ORDER — NAPROXEN 500 MG PO TABS: 500.0000 mg | ORAL_TABLET | Freq: Two times a day (BID) | ORAL | 0 refills | Status: DC

## 2023-01-15 NOTE — ED Provider Notes (Signed)
[facility]  Note:  This document was prepared using Systems analyst and may include unintentional dictation errors.  MRN: [id number] DOB: 06-05-1971  Subjective:   [name] is a 52 y.o. female presenting for persistent right ankle pain since yesterday.  Symptoms started from falling several flights of stairs.  Unfortunately, her right lower leg and under her with her right foot and ankle in hyper plantarflexion.  Has had swelling, slight bruising.  Has not taken medications for relief.  She is able to bear weight but with pain.  No current facility-administered medications for this encounter.  Current Outpatient Medications:    acetaminophen (TYLENOL 8 HOUR) 650 MG CR tablet, Take 1 tablet (650 mg total) by mouth every 8 (eight) hours as needed for pain or fever. (Patient not taking: Reported on 07/22/2022), Disp: 30 tablet, Rfl: 0   amLODipine (NORVASC) 5 MG tablet, TAKE 1 TABLET BY MOUTH ONCE A DAY, Disp: 90 tablet, Rfl: 1   COVID-19 At Home Antigen Test (CARESTART COVID-19 HOME TEST) KIT, USE AS DIRECTED, Disp: 4 each, Rfl: 0   COVID-19 At Home Antigen Test (CARESTART COVID-19 HOME TEST) KIT, use as directed within package instructions, Disp: 4 kit, Rfl: 0   leuprolide (LUPRON DEPOT, 8-MONTH,) 3.75 MG injection, Inject into the muscle once a month, Disp: 1 each, Rfl: 6   norethindrone (AYGESTIN) 5 MG tablet, TAKE 1 TABLET BY MOUTH DAILY, Disp: 30 tablet, Rfl: 3   norethindrone (AYGESTIN) 5 MG tablet, Take 1 tablet by mouth 3 times daily until bleeding stops. Once bleeding stops, take 1 tablet twice daily for 3 days then 1 tablet daily until otherwise instructed., Disp: 30 tablet, Rfl: 0   tranexamic acid (LYSTEDA) 650 MG TABS tablet, Take 2 tablets (1,300 mg total) by mouth 3 (three) times daily for 5 days start with first day of menstrual cycle monthly., Disp: 30 tablet, Rfl: 7   No Known Allergies  Past Medical History:  Diagnosis  Date   Abnormal Pap smear    Anemia    Endometriosis    Fibroids    Hypertension    Ovarian cyst      Past Surgical History:  Procedure Laterality Date    diagnostic laparscopic  06/2004   CHROMOPERTUBATION N/A 11/28/2013   Procedure: CHROMOPERTUBATION, ;  Surgeon: Lovenia Kim, MD;  Location: [facility];  Service: Gynecology;  Laterality: N/A;   DIAGNOSTIC LAPAROSCOPY  2005   [place] N/A 12/30/2018   Procedure: DILATATION & CURETTAGE/HYSTEROSCOPY WITH MYOSURE;  Surgeon: Brien Few, MD;  Location: [facility];  Service: Gynecology;  Laterality: N/A;   HYSTEROSCOPY WITH NOVASURE N/A 12/30/2018   Procedure: HYSTEROSCOPY WITH NOVASURE, POSSIBLE HYDROTHERMAL ABLATION;  Surgeon: Brien Few, MD;  Location: [facility];  Service: Gynecology;  Laterality: N/A;   OVARIAN CYST REMOVAL  2015   ROBOTIC ASSISTED LAPAROSCOPIC LYSIS OF ADHESION N/A 11/28/2013   Procedure: ROBOTIC ASSISTED LAPAROSCOPIC LYSIS OF ADHESION; EXCISION OF RIGHT OVARIAN CYST WALL AND MYOMECTOMY;  Surgeon: Lovenia Kim, MD;  Location: [facility];  Service: Gynecology;  Laterality: N/A;   UTERINE FIBROID SURGERY  2015   WISDOM TOOTH EXTRACTION      Family History  Problem Relation Age of Onset   Hypertension Mother    Stroke Mother    Hypertension Father    Heart disease Father    Stroke Maternal Grandfather    Hypertension Maternal Grandfather    Cancer Paternal Grandmother  stomach cancer   Hyperlipidemia Paternal Grandmother     Social History   Tobacco Use   Smoking status: Never   Smokeless tobacco: Never  Vaping Use   Vaping Use: Never used  Substance Use Topics   Alcohol use: No   Drug use: No    ROS   Objective:   Vitals: BP 122/75 (BP Location: Right Arm)   Pulse 95   Temp 98 F (36.7 C) (Oral)   Resp 20   SpO2 99%   Physical Exam Constitutional:      General: She is not in acute distress.    Appearance: Normal appearance.  She is well-developed. She is not ill-appearing, toxic-appearing or diaphoretic.  HENT:     Head: Normocephalic and atraumatic.     Nose: Nose normal.     Mouth/Throat:     Mouth: Mucous membranes are moist.  Eyes:     General: No scleral icterus.       Right eye: No discharge.        Left eye: No discharge.     Extraocular Movements: Extraocular movements intact.  Cardiovascular:     Rate and Rhythm: Normal rate.  Pulmonary:     Effort: Pulmonary effort is normal.  Musculoskeletal:     Right ankle: Swelling (laterally) present. No deformity, ecchymosis or lacerations. Tenderness (over area outlined) present. Normal range of motion. Anterior drawer test negative. Normal pulse.     Right Achilles Tendon: No tenderness or defects. Thompson's test negative.       Legs:  Skin:    General: Skin is warm and dry.  Neurological:     General: No focal deficit present.     Mental Status: She is alert and oriented to person, place, and time.  Psychiatric:        Mood and Affect: Mood normal.        Behavior: Behavior normal.    DG Ankle Complete Right  Result Date: 01/15/2023 CLINICAL DATA:  Status post fall. EXAM: RIGHT ANKLE - COMPLETE 3+ VIEW COMPARISON:  None Available. FINDINGS: Irregularity of the distal dorsal talus adjacent to the talonavicular joint concerning for a small avulsion fracture. No other fracture or dislocation. No aggressive osseous lesion. Normal alignment. Soft tissue are unremarkable. No radiopaque foreign body or soft tissue emphysema. IMPRESSION: 1. Irregularity of the distal dorsal talus adjacent to the talonavicular joint concerning for a small avulsion fracture. Electronically Signed   By: Kathreen Devoid M.D.   On: 01/15/2023 11:08    Patient placed into a short leg splint at a 90 degree angle at the ankle.  Patient provided with crutches as well.  Assessment and Plan :   PDMP not reviewed this encounter.  1. Acute right ankle pain   2. Closed nondisplaced  fracture of dome of right talus, initial encounter     Patient splinted as above.  Will manage for nondisplaced fracture of the dome of the right talus.  Nonweightbearing, use crutches to ambulate.  She declined medications for pain relief and so I simply prescribed naproxen just in case.  Follow-up with [facility] which is the patient's orthopedic practice of choice. Counseled patient on potential for adverse effects with medications prescribed/recommended today, ER and return-to-clinic precautions discussed, patient verbalized understanding.    Jaynee Eagles, [place] 01/15/23 1206

## 2023-01-15 NOTE — Discharge Instructions (Addendum)
You have a talar fracture. Wear the splint at all times, use crutches to move/walk. Use naproxen for pain and inflammation.

## 2023-01-15 NOTE — ED Triage Notes (Signed)
Pt states she fell down stairs yesterday-pain to right ankle-limping gait-NAD

## 2023-01-18 ENCOUNTER — Other Ambulatory Visit: Payer: Self-pay

## 2023-01-18 ENCOUNTER — Other Ambulatory Visit (HOSPITAL_COMMUNITY): Payer: Self-pay

## 2023-01-18 DIAGNOSIS — M25571 Pain in right ankle and joints of right foot: Secondary | ICD-10-CM | POA: Diagnosis not present

## 2023-01-19 ENCOUNTER — Other Ambulatory Visit (HOSPITAL_COMMUNITY): Payer: Self-pay

## 2023-01-21 ENCOUNTER — Other Ambulatory Visit (HOSPITAL_COMMUNITY): Payer: Self-pay

## 2023-01-30 ENCOUNTER — Other Ambulatory Visit (HOSPITAL_COMMUNITY): Payer: Self-pay

## 2023-02-01 ENCOUNTER — Other Ambulatory Visit (HOSPITAL_COMMUNITY): Payer: Self-pay

## 2023-02-05 ENCOUNTER — Other Ambulatory Visit (HOSPITAL_COMMUNITY): Payer: Self-pay

## 2023-02-05 MED ORDER — LUPRON DEPOT (1-MONTH) 3.75 MG IM KIT
PACK | INTRAMUSCULAR | 2 refills | Status: DC
  Filled 2023-02-05: qty 1, 30d supply, fill #0
  Filled 2023-03-08: qty 1, 30d supply, fill #1
  Filled 2023-04-13: qty 1, 30d supply, fill #2

## 2023-02-08 ENCOUNTER — Other Ambulatory Visit: Payer: Self-pay | Admitting: Family Medicine

## 2023-02-08 ENCOUNTER — Other Ambulatory Visit (HOSPITAL_COMMUNITY): Payer: Self-pay

## 2023-02-08 DIAGNOSIS — M25571 Pain in right ankle and joints of right foot: Secondary | ICD-10-CM | POA: Diagnosis not present

## 2023-02-08 DIAGNOSIS — I10 Essential (primary) hypertension: Secondary | ICD-10-CM

## 2023-02-08 DIAGNOSIS — N809 Endometriosis, unspecified: Secondary | ICD-10-CM | POA: Diagnosis not present

## 2023-02-08 MED ORDER — AMLODIPINE BESYLATE 5 MG PO TABS
ORAL_TABLET | Freq: Every day | ORAL | 0 refills | Status: DC
  Filled 2023-02-08: qty 90, 90d supply, fill #0

## 2023-02-19 DIAGNOSIS — D259 Leiomyoma of uterus, unspecified: Secondary | ICD-10-CM | POA: Diagnosis not present

## 2023-02-19 DIAGNOSIS — N921 Excessive and frequent menstruation with irregular cycle: Secondary | ICD-10-CM | POA: Diagnosis not present

## 2023-03-08 ENCOUNTER — Other Ambulatory Visit (HOSPITAL_COMMUNITY): Payer: Self-pay

## 2023-03-09 ENCOUNTER — Other Ambulatory Visit (HOSPITAL_COMMUNITY): Payer: Self-pay

## 2023-03-10 ENCOUNTER — Other Ambulatory Visit (HOSPITAL_COMMUNITY): Payer: Self-pay

## 2023-03-19 DIAGNOSIS — N809 Endometriosis, unspecified: Secondary | ICD-10-CM | POA: Diagnosis not present

## 2023-04-02 ENCOUNTER — Other Ambulatory Visit (HOSPITAL_COMMUNITY): Payer: Self-pay

## 2023-04-02 MED ORDER — NORETHINDRONE ACETATE 5 MG PO TABS
5.0000 mg | ORAL_TABLET | Freq: Every day | ORAL | 0 refills | Status: DC
  Filled 2023-04-02: qty 90, 90d supply, fill #0
  Filled 2023-06-08: qty 30, 30d supply, fill #0

## 2023-04-13 ENCOUNTER — Other Ambulatory Visit (HOSPITAL_COMMUNITY): Payer: Self-pay

## 2023-04-14 ENCOUNTER — Other Ambulatory Visit (HOSPITAL_COMMUNITY): Payer: Self-pay

## 2023-04-16 DIAGNOSIS — N809 Endometriosis, unspecified: Secondary | ICD-10-CM | POA: Diagnosis not present

## 2023-05-10 ENCOUNTER — Other Ambulatory Visit (HOSPITAL_COMMUNITY): Payer: Self-pay

## 2023-05-11 ENCOUNTER — Other Ambulatory Visit (HOSPITAL_COMMUNITY): Payer: Self-pay

## 2023-05-12 ENCOUNTER — Other Ambulatory Visit (HOSPITAL_COMMUNITY): Payer: Self-pay

## 2023-05-13 DIAGNOSIS — D251 Intramural leiomyoma of uterus: Secondary | ICD-10-CM | POA: Diagnosis not present

## 2023-05-13 DIAGNOSIS — D252 Subserosal leiomyoma of uterus: Secondary | ICD-10-CM | POA: Diagnosis not present

## 2023-05-13 DIAGNOSIS — D259 Leiomyoma of uterus, unspecified: Secondary | ICD-10-CM | POA: Diagnosis not present

## 2023-05-14 DIAGNOSIS — N809 Endometriosis, unspecified: Secondary | ICD-10-CM | POA: Diagnosis not present

## 2023-06-05 ENCOUNTER — Other Ambulatory Visit (HOSPITAL_COMMUNITY): Payer: Self-pay

## 2023-06-08 ENCOUNTER — Other Ambulatory Visit: Payer: Self-pay | Admitting: Family Medicine

## 2023-06-08 ENCOUNTER — Other Ambulatory Visit (HOSPITAL_COMMUNITY): Payer: Self-pay

## 2023-06-08 DIAGNOSIS — I10 Essential (primary) hypertension: Secondary | ICD-10-CM

## 2023-06-09 ENCOUNTER — Other Ambulatory Visit (HOSPITAL_COMMUNITY): Payer: Self-pay

## 2023-06-09 MED ORDER — AMLODIPINE BESYLATE 5 MG PO TABS
5.0000 mg | ORAL_TABLET | Freq: Every day | ORAL | 0 refills | Status: DC
  Filled 2023-06-09: qty 30, 30d supply, fill #0

## 2023-06-09 NOTE — Telephone Encounter (Signed)
Patient needs appointment for additional refills.

## 2023-06-10 NOTE — Telephone Encounter (Signed)
Patient scheduled in office on 06/28/23, thanks.

## 2023-06-10 NOTE — Telephone Encounter (Signed)
Called patient, patient would like to know if she could do this appointment via MyChart, please advise, thanks.

## 2023-06-11 DIAGNOSIS — N809 Endometriosis, unspecified: Secondary | ICD-10-CM | POA: Diagnosis not present

## 2023-06-28 ENCOUNTER — Ambulatory Visit: Payer: Commercial Managed Care - PPO | Admitting: Family Medicine

## 2023-07-05 ENCOUNTER — Other Ambulatory Visit (HOSPITAL_COMMUNITY): Payer: Self-pay

## 2023-07-10 ENCOUNTER — Other Ambulatory Visit (HOSPITAL_COMMUNITY): Payer: Self-pay

## 2023-07-12 DIAGNOSIS — N809 Endometriosis, unspecified: Secondary | ICD-10-CM | POA: Diagnosis not present

## 2023-07-27 ENCOUNTER — Encounter: Payer: Self-pay | Admitting: Family Medicine

## 2023-07-27 ENCOUNTER — Ambulatory Visit: Payer: Commercial Managed Care - PPO | Admitting: Family Medicine

## 2023-07-27 VITALS — BP 125/81 | HR 85 | Ht 67.0 in | Wt 154.0 lb

## 2023-07-27 DIAGNOSIS — Z13220 Encounter for screening for lipoid disorders: Secondary | ICD-10-CM

## 2023-07-27 DIAGNOSIS — Z0000 Encounter for general adult medical examination without abnormal findings: Secondary | ICD-10-CM | POA: Diagnosis not present

## 2023-07-27 DIAGNOSIS — I10 Essential (primary) hypertension: Secondary | ICD-10-CM | POA: Diagnosis not present

## 2023-07-27 DIAGNOSIS — D509 Iron deficiency anemia, unspecified: Secondary | ICD-10-CM | POA: Diagnosis not present

## 2023-07-27 NOTE — Progress Notes (Signed)
[name] - 52 y.o. female MRN [id number]  Date of birth: 1971-04-05  Subjective Chief Complaint  Patient presents with   Hypertension    HPI [name] is a 52 y.o. female here today for follow up visit.   She reports that she is doing well.  Prescribed amlodipine for management of HTN.  BP is well controlled, however she has been off of medication for about 4 days.  She would like to discontinue medication is possible.  She has not experienced side effects.  She has not had chest pain, shortness of breath, palpitations, headache or vision changes.    She is seeing GYN for history of fibroid.  Stable at this time.   ROS:  A comprehensive ROS was completed and negative except as noted per HPI  No Known Allergies  Past Medical History:  Diagnosis Date   Abnormal Pap smear    Anemia    Endometriosis    Fibroids    Hypertension    Ovarian cyst     Past Surgical History:  Procedure Laterality Date    diagnostic laparscopic  06/2004   CHROMOPERTUBATION N/A 11/28/2013   Procedure: CHROMOPERTUBATION, ;  Surgeon: Lenoard Aden, MD;  Location: [facility];  Service: Gynecology;  Laterality: N/A;   DIAGNOSTIC LAPAROSCOPY  2005   [place]   DILATATION & CURETTAGE/HYSTEROSCOPY WITH MYOSURE N/A 12/30/2018   Procedure: DILATATION & CURETTAGE/HYSTEROSCOPY WITH MYOSURE;  Surgeon: Olivia Mackie, MD;  Location: [facility];  Service: Gynecology;  Laterality: N/A;   HYSTEROSCOPY WITH NOVASURE N/A 12/30/2018   Procedure: HYSTEROSCOPY WITH NOVASURE, POSSIBLE HYDROTHERMAL ABLATION;  Surgeon: Olivia Mackie, MD;  Location: [facility];  Service: Gynecology;  Laterality: N/A;   OVARIAN CYST REMOVAL  2015   ROBOTIC ASSISTED LAPAROSCOPIC LYSIS OF ADHESION N/A 11/28/2013   Procedure: ROBOTIC ASSISTED LAPAROSCOPIC LYSIS OF ADHESION; EXCISION OF RIGHT OVARIAN CYST WALL AND MYOMECTOMY;  Surgeon: Lenoard Aden, MD;  Location: [facility];  Service: Gynecology;  Laterality: N/A;   UTERINE FIBROID  SURGERY  2015   WISDOM TOOTH EXTRACTION      Social History   Socioeconomic History   Marital status: Married    Spouse name: Not on file   Number of children: Not on file   Years of education: Not on file   Highest education level: Not on file  Occupational History   Not on file  Tobacco Use   Smoking status: Never   Smokeless tobacco: Never  Vaping Use   Vaping status: Never Used  Substance and Sexual Activity   Alcohol use: No   Drug use: No   Sexual activity: Not on file  Other Topics Concern   Not on file  Social History Narrative   Not on file   Social Determinants of Health   Financial Resource Strain: Not on file  Food Insecurity: Not on file  Transportation Needs: Not on file  Physical Activity: Not on file  Stress: Not on file  Social Connections: Not on file    Family History  Problem Relation Age of Onset   Hypertension Mother    Stroke Mother    Hypertension Father    Heart disease Father    Stroke Maternal Grandfather    Hypertension Maternal Grandfather    Cancer Paternal Grandmother        stomach cancer   Hyperlipidemia Paternal Grandmother     Health Maintenance  Topic Date Due   DTaP/Tdap/Td (1 - Tdap) Never done   Colonoscopy  Never done   COVID-19 Vaccine (3 - 2023-24 season) 08/12/2023 (Originally 07/31/2022)   PAP SMEAR-Modifier  10/27/2023 (Originally 09/12/2021)   Zoster Vaccines- Shingrix (2 of 2) 10/27/2023 (Originally 09/16/2022)   INFLUENZA VACCINE  02/28/2024 (Originally 07/01/2023)   Hepatitis C Screening  07/26/2024 (Originally 04/06/1989)   HIV Screening  07/26/2024 (Originally 04/06/1986)   MAMMOGRAM  07/10/2024   HPV VACCINES  Aged Out     ----------------------------------------------------------------------------------------------------------------------------------------------------------------------------------------------------------------- Physical Exam BP 125/81 (BP Location: Left Arm, Patient Position: Sitting,  Cuff Size: Normal)   Pulse 85   Ht 5\' 7"  (1.702 m)   Wt 154 lb (69.9 kg)   SpO2 99%   BMI 24.12 kg/m   Physical Exam Constitutional:      Appearance: Normal appearance.  Eyes:     General: No scleral icterus. Cardiovascular:     Rate and Rhythm: Normal rate and regular rhythm.  Pulmonary:     Effort: Pulmonary effort is normal.     Breath sounds: Normal breath sounds.  Neurological:     Mental Status: She is alert.  Psychiatric:        Mood and Affect: Mood normal.        Behavior: Behavior normal.     ------------------------------------------------------------------------------------------------------------------------------------------------------------------------------------------------------------------- Assessment and Plan  Essential hypertension BP is well controlled.  Off of amlodipine for several days and BP is well controlled. She may d/c amlodipine at this time.  Recommend monitoring of BP at home.    No orders of the defined types were placed in this encounter.   No follow-ups on file.    This visit occurred during the SARS-CoV-2 public health emergency.  Safety protocols were in place, including screening questions prior to the visit, additional usage of staff PPE, and extensive cleaning of exam room while observing appropriate contact time as indicated for disinfecting solutions.

## 2023-07-27 NOTE — Assessment & Plan Note (Signed)
BP is well controlled.  Off of amlodipine for several days and BP is well controlled. She may d/c amlodipine at this time.  Recommend monitoring of BP at home.

## 2023-07-28 LAB — CMP14+EGFR
ALT: 10 IU/L (ref 0–32)
AST: 19 IU/L (ref 0–40)
Albumin: 4.3 g/dL (ref 3.8–4.9)
Alkaline Phosphatase: 81 IU/L (ref 44–121)
BUN/Creatinine Ratio: 14 (ref 9–23)
BUN: 10 mg/dL (ref 6–24)
Bilirubin Total: 0.3 mg/dL (ref 0.0–1.2)
CO2: 23 mmol/L (ref 20–29)
Calcium: 9.2 mg/dL (ref 8.7–10.2)
Chloride: 105 mmol/L (ref 96–106)
Creatinine, Ser: 0.71 mg/dL (ref 0.57–1.00)
Globulin, Total: 2.5 g/dL (ref 1.5–4.5)
Glucose: 66 mg/dL — ABNORMAL LOW (ref 70–99)
Potassium: 3.9 mmol/L (ref 3.5–5.2)
Sodium: 142 mmol/L (ref 134–144)
Total Protein: 6.8 g/dL (ref 6.0–8.5)
eGFR: 102 mL/min/{1.73_m2} (ref >59)

## 2023-07-28 LAB — IRON,TIBC AND FERRITIN PANEL
Ferritin: 6 ng/mL — ABNORMAL LOW (ref 15–150)
Iron Saturation: 5 % — CL (ref 15–55)
Iron: 22 ug/dL — ABNORMAL LOW (ref 27–159)
Total Iron Binding Capacity: 475 ug/dL — ABNORMAL HIGH (ref 250–450)
UIBC: 453 ug/dL — ABNORMAL HIGH (ref 131–425)

## 2023-07-28 LAB — LIPID PANEL WITH LDL/HDL RATIO
Cholesterol, Total: 189 mg/dL (ref 100–199)
HDL: 43 mg/dL (ref >39)
LDL Chol Calc (NIH): 134 mg/dL — ABNORMAL HIGH (ref 0–99)
LDL/HDL Ratio: 3.1 ratio (ref 0.0–3.2)
Triglycerides: 66 mg/dL (ref 0–149)
VLDL Cholesterol Cal: 12 mg/dL (ref 5–40)

## 2023-07-28 LAB — CBC WITH DIFFERENTIAL/PLATELET
Basophils Absolute: 0 10*3/uL (ref 0.0–0.2)
Basos: 1 %
EOS (ABSOLUTE): 0.1 10*3/uL (ref 0.0–0.4)
Eos: 1 %
Hematocrit: 33.4 % — ABNORMAL LOW (ref 34.0–46.6)
Hemoglobin: 10.2 g/dL — ABNORMAL LOW (ref 11.1–15.9)
Immature Grans (Abs): 0 10*3/uL (ref 0.0–0.1)
Immature Granulocytes: 0 %
Lymphocytes Absolute: 2.4 10*3/uL (ref 0.7–3.1)
Lymphs: 40 %
MCH: 23.8 pg — ABNORMAL LOW (ref 26.6–33.0)
MCHC: 30.5 g/dL — ABNORMAL LOW (ref 31.5–35.7)
MCV: 78 fL — ABNORMAL LOW (ref 79–97)
Monocytes Absolute: 0.3 10*3/uL (ref 0.1–0.9)
Monocytes: 5 %
Neutrophils Absolute: 3.3 10*3/uL (ref 1.4–7.0)
Neutrophils: 53 %
Platelets: 371 10*3/uL (ref 150–450)
RBC: 4.28 x10E6/uL (ref 3.77–5.28)
RDW: 16.4 % — ABNORMAL HIGH (ref 11.7–15.4)
WBC: 6.1 10*3/uL (ref 3.4–10.8)

## 2023-08-06 ENCOUNTER — Other Ambulatory Visit (HOSPITAL_COMMUNITY): Payer: Self-pay

## 2023-08-06 ENCOUNTER — Other Ambulatory Visit: Payer: Self-pay | Admitting: Family Medicine

## 2023-08-06 ENCOUNTER — Encounter: Payer: Self-pay | Admitting: Family Medicine

## 2023-08-06 DIAGNOSIS — I10 Essential (primary) hypertension: Secondary | ICD-10-CM

## 2023-08-06 MED ORDER — FUSION PLUS PO CAPS
1.0000 | ORAL_CAPSULE | Freq: Every day | ORAL | 4 refills | Status: DC
  Filled 2023-08-06: qty 30, 30d supply, fill #0

## 2023-08-06 MED ORDER — LUPRON DEPOT (1-MONTH) 3.75 MG IM KIT
3.7500 mg | PACK | INTRAMUSCULAR | 0 refills | Status: DC
Start: 2023-08-06 — End: 2024-04-18
  Filled 2023-08-06 – 2023-08-27 (×2): qty 1, 30d supply, fill #0

## 2023-08-06 MED ORDER — AMLODIPINE BESYLATE 5 MG PO TABS
5.0000 mg | ORAL_TABLET | Freq: Every day | ORAL | 2 refills | Status: DC
Start: 2023-08-06 — End: 2023-11-06
  Filled 2023-08-06: qty 30, 30d supply, fill #0
  Filled 2023-09-03: qty 30, 30d supply, fill #1
  Filled 2023-10-01: qty 30, 30d supply, fill #2

## 2023-08-06 NOTE — Addendum Note (Signed)
Addended by: Chalmers Cater on: 08/06/2023 10:26 AM   Modules accepted: Orders

## 2023-08-06 NOTE — Addendum Note (Signed)
Addended by: Everrett Coombe E on: 08/06/2023 12:50 PM   Modules accepted: Orders

## 2023-08-07 ENCOUNTER — Other Ambulatory Visit (HOSPITAL_COMMUNITY): Payer: Self-pay

## 2023-08-12 DIAGNOSIS — Z118 Encounter for screening for other infectious and parasitic diseases: Secondary | ICD-10-CM | POA: Diagnosis not present

## 2023-08-12 DIAGNOSIS — Z1159 Encounter for screening for other viral diseases: Secondary | ICD-10-CM | POA: Diagnosis not present

## 2023-08-12 DIAGNOSIS — Z1231 Encounter for screening mammogram for malignant neoplasm of breast: Secondary | ICD-10-CM | POA: Diagnosis not present

## 2023-08-12 DIAGNOSIS — Z01419 Encounter for gynecological examination (general) (routine) without abnormal findings: Secondary | ICD-10-CM | POA: Diagnosis not present

## 2023-08-12 DIAGNOSIS — Z01411 Encounter for gynecological examination (general) (routine) with abnormal findings: Secondary | ICD-10-CM | POA: Diagnosis not present

## 2023-08-12 DIAGNOSIS — Z124 Encounter for screening for malignant neoplasm of cervix: Secondary | ICD-10-CM | POA: Diagnosis not present

## 2023-08-12 DIAGNOSIS — Z1331 Encounter for screening for depression: Secondary | ICD-10-CM | POA: Diagnosis not present

## 2023-08-12 DIAGNOSIS — Z114 Encounter for screening for human immunodeficiency virus [HIV]: Secondary | ICD-10-CM | POA: Diagnosis not present

## 2023-08-12 DIAGNOSIS — Z113 Encounter for screening for infections with a predominantly sexual mode of transmission: Secondary | ICD-10-CM | POA: Diagnosis not present

## 2023-08-27 ENCOUNTER — Other Ambulatory Visit (HOSPITAL_BASED_OUTPATIENT_CLINIC_OR_DEPARTMENT_OTHER): Payer: Self-pay

## 2023-08-27 ENCOUNTER — Other Ambulatory Visit (HOSPITAL_COMMUNITY): Payer: Self-pay

## 2023-09-03 ENCOUNTER — Other Ambulatory Visit (HOSPITAL_COMMUNITY): Payer: Self-pay

## 2023-09-03 DIAGNOSIS — N809 Endometriosis, unspecified: Secondary | ICD-10-CM | POA: Diagnosis not present

## 2023-09-27 ENCOUNTER — Other Ambulatory Visit (HOSPITAL_COMMUNITY): Payer: Self-pay

## 2023-09-30 ENCOUNTER — Other Ambulatory Visit (HOSPITAL_COMMUNITY): Payer: Self-pay

## 2023-09-30 DIAGNOSIS — Z01818 Encounter for other preprocedural examination: Secondary | ICD-10-CM | POA: Diagnosis not present

## 2023-09-30 DIAGNOSIS — Z23 Encounter for immunization: Secondary | ICD-10-CM | POA: Diagnosis not present

## 2023-09-30 MED ORDER — OXYCODONE HCL 5 MG PO TABS
5.0000 mg | ORAL_TABLET | ORAL | 0 refills | Status: DC | PRN
  Filled 2023-09-30: qty 15, 3d supply, fill #0

## 2023-09-30 MED ORDER — IBUPROFEN 600 MG PO TABS
600.0000 mg | ORAL_TABLET | Freq: Four times a day (QID) | ORAL | 2 refills | Status: DC
  Filled 2023-09-30: qty 30, 8d supply, fill #0

## 2023-10-01 ENCOUNTER — Other Ambulatory Visit (HOSPITAL_COMMUNITY): Payer: Self-pay

## 2023-10-01 DIAGNOSIS — N809 Endometriosis, unspecified: Secondary | ICD-10-CM | POA: Diagnosis not present

## 2023-10-01 DIAGNOSIS — D259 Leiomyoma of uterus, unspecified: Secondary | ICD-10-CM | POA: Diagnosis not present

## 2023-10-21 ENCOUNTER — Other Ambulatory Visit (HOSPITAL_COMMUNITY): Payer: Self-pay

## 2023-10-21 DIAGNOSIS — D251 Intramural leiomyoma of uterus: Secondary | ICD-10-CM | POA: Diagnosis not present

## 2023-10-21 DIAGNOSIS — Z9889 Other specified postprocedural states: Secondary | ICD-10-CM | POA: Diagnosis not present

## 2023-10-21 DIAGNOSIS — N921 Excessive and frequent menstruation with irregular cycle: Secondary | ICD-10-CM | POA: Diagnosis not present

## 2023-10-21 DIAGNOSIS — N8003 Adenomyosis of the uterus: Secondary | ICD-10-CM | POA: Diagnosis not present

## 2023-10-21 DIAGNOSIS — Z79899 Other long term (current) drug therapy: Secondary | ICD-10-CM | POA: Diagnosis not present

## 2023-10-21 DIAGNOSIS — G8918 Other acute postprocedural pain: Secondary | ICD-10-CM | POA: Diagnosis not present

## 2023-10-21 DIAGNOSIS — K660 Peritoneal adhesions (postprocedural) (postinfection): Secondary | ICD-10-CM | POA: Diagnosis not present

## 2023-10-21 DIAGNOSIS — Z791 Long term (current) use of non-steroidal anti-inflammatories (NSAID): Secondary | ICD-10-CM | POA: Diagnosis not present

## 2023-10-21 DIAGNOSIS — N7011 Chronic salpingitis: Secondary | ICD-10-CM | POA: Diagnosis not present

## 2023-10-21 DIAGNOSIS — D252 Subserosal leiomyoma of uterus: Secondary | ICD-10-CM | POA: Diagnosis not present

## 2023-10-21 DIAGNOSIS — I10 Essential (primary) hypertension: Secondary | ICD-10-CM | POA: Diagnosis not present

## 2023-10-21 DIAGNOSIS — N736 Female pelvic peritoneal adhesions (postinfective): Secondary | ICD-10-CM | POA: Diagnosis not present

## 2023-10-21 DIAGNOSIS — D259 Leiomyoma of uterus, unspecified: Secondary | ICD-10-CM | POA: Diagnosis not present

## 2023-10-21 DIAGNOSIS — K682 Retroperitoneal fibrosis: Secondary | ICD-10-CM | POA: Diagnosis not present

## 2023-10-21 MED ORDER — ESTRADIOL 0.05 MG/24HR TD PTTW
1.0000 | MEDICATED_PATCH | TRANSDERMAL | 3 refills | Status: DC
  Filled 2023-10-21: qty 24, 84d supply, fill #0
  Filled 2024-01-28: qty 24, 84d supply, fill #1

## 2023-10-21 NOTE — Procedures (Signed)
 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]   Procedure(s): ROBOT ASSISTED-TOTAL LAPAROSCOPIC HYSTERECTOMY WITH BILATERAL SALPINGO-OOPHORECTOMIES; PELVIC EXAMINATION UNDER ANESTHESIA, LYSIS OF ADHESIONS; BILATERAL URETEROLYSIS  SURGEON: Surgeons and Role:    * Sobolewski, Lorrene Pac, MD - Primary    * Lucillie Albino Jacobsen, MD - Resident - Assisting    * Wilmer, Aleene Skates, MD - Secondary    * Mliss Cordia, MD - Secondary    * Mariea, Sherida Babara Askew, MD - Secondary    * Nena Marolyn Lenis, MD - Secondary  STAFF: Circulator: Clem Been, RN; Juanda Francisco, RN; Sanjuanita Bi, RN Scrub Person: Cullen Kent, RN; Brandy Bald, RN  ANESTHESIA: General   INDICATION(S): [name] is a 52 y.o. patient who presents for a TLH/BSO.  Had recurrent abnormal uterine bleeding.  Recently started Lupron  and her bleeding finally stopped.  Had a similar episode in 2022.  History of laparoscopy in 2014 during which she had a myomectomy, and lysis of adhesions.  OPERATIVE FINDINGS: Normal external genitalia.  Cervix was unremarkable.  Uterus was mobile and approximately 13-14 weeks size.  Normal liver edge.  Normal appendix.  Uterus enlarged with moderate fibroids.  Hydrosalpinges.  The posterior cul-de-sac was completely obliterated.  The rectosigmoid was densely adherent to the posterior uterus.  OPERATIVE REPORT:  The patient identified herself as Clinical research associate and, once under the influence of adequate general endotracheal tube anesthesia, was placed into the low lithotomy position. Care was taken to ensure that there was not over extension or flexion  at the hips or knees. Her arms were carefully padded and tucked at the sides with the hands in neutral positions. A time-out procedure was performed during which the patient identification, planned procedures, allergies, and antibiotic orders were agreed upon by the operating room and anesthesia staff in attendance. Sequential compression devices were placed on the bilateral lower extremities for thromboprophylaxis. Cefazolin  and metronidazole were given perioperatively for antibiotic prophylaxis. The abdomen was prepared with ChloraPrep and the vagina and perineum were prepared with dilute Hibiclens solution. The patient was draped in the usual sterile fashion. A Foley catheter was placed transurethrally. A speculum was inserted. The anterior lip of the cervix grasped with a single-tooth tenaculum. The uterus was sounded to 9 cm. An appropriately-sized RUMI tip was assembled to the handle and placed transcervically and attached to the Searles system.  The outer gloves were changed. A 5 mm incision was made in an area 1 fingerbreadth beneath the left costal margin in the mid clavicular line.  A 5 mm optical trocar with the laparoscope within was inserted while we visualized the layers of the abdominal wall.   The trocar was removed and the laparoscope was passed through the cannula and intra-abdominal placement was confirmed. The umbilicus was everted with the assistance of 2 Allis clamps. A vertical intraumbilical incision was made and while elevating the abdomen bilaterally at the level of the umbilicus, a robotic trocar was placed under visualization. This allowed for a grasper to be placed to help manipulate the bowels. The area immediately beneath the trocar insertion site was carefully inspected and there was a small mesentery injury but no bleeding and no bowel injury.  The patient was then placed into steep Trendelenburg. The above mentioned findings were noted.  We placed a robotic port in the left lateral  quadrant at the level of the umbilicus. We placed a robotic trocar in a similar location on the right.  We placed a 3rd robotic trocar in between the right lateral trocar and the midline trocar. Each of these were placed under direct laparoscopic visualization. We placed our camera through a lateral port and viewed our initial insertion port site to ensure that there was no evidence of a through-and-through injury. At this point, the patient's side cart was brought to the right side of the patient and docked to our robotic trocars and camera port. In the far right lateral port, which was the #4 arm, we placed a monopolar shears. In the #1 arm on the left side we placed a fenestrated bipolar and in the #3 arm we placed a Tip Up grasper.  I then began nulligravid venous task of adhesiolysis.  I mobilized the left sigmoid colon at the white line of Toldt.  The round ligaments were desiccated with bipolar bilaterally and transected.  The retroperitoneum was opened up lateral to the ovarian vessels a complete ureterolysis was performed bilaterally.  This was necessary due to the dense retroperitoneal fibrosis and sigmoid adhesions.  I was able to isolate the vessels within the I-P ligaments bilaterally and created a fenestration in the broad ligament underneath them.  Thus isolated, the vessels were desiccated and then transected.  We then retroflexed the uterus and I continued the dissection anteriorly.  Once I identified the edge of the colpotomy cup the bladder was dissected off of the lower uterine segment and cervix with the monopolar scissors.  At this point, it was clear that I had completed as much dissection as I was able to and that I needed assistance with the colonic adhesions.  The general surgeon on-call was paged.  Alternatively Dr. Mliss, Dr. Wilmer and Dr. Nena sharing the very tedious and tenuous task of freeing the colon from its dense posterior adhesions.  At its distalmost extent, there was a  soft, masslike structure that is difficult to delineate from the overlying.  Therefore the general surgery team asked for Dr. Mariea to assist.  As I waited, I performed an anterior colpotomy. There was definitely some fibrotic endometriosis in the rectouterine space.  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  Once this was finally recognized, I could continue the colpotomy posteriorly. The specimen was delivered intact transvaginally.  The cuff was closed in a continuous running fashion using 2-0 V-Loc. At this point, the Foley was clamped and the bladder was retrograde filled with approximately 200 mL of sterile water . The Foley catheter was removed and a 5 mm laparoscope was placed transurethrally. Normal efflux of urine was noted from the bilateral ureteral orifices. The uroepithelium was normal circumferentially around the bladder. There was no evidence of suture or other injury to the bladder. The laparoscope was removed. We did one final low-pressure look and again confirmed adequate hemostasis, and the procedure was terminated. The gas was manually expressed and all instruments were removed.  The skin of all of our incisions were closed with subcuticular sutures of 4-0 Biosyn. The bladder was drained and the Foley catheter was removed and left out. Sponge and instrument counts taken at this time coincided with the preoperative count. The patient tolerated  the procedure well and was transferred to the recovery room in satisfactory condition.     ESTIMATED BLOOD LOSS:    100 mL from 10/21/2023  7:57 AM to 10/21/2023 12:42 PM   Nasogastric/ Orogastric Tube 18 french Oral (Active)     SPECIMENS:  ID Type Source Tests Collected by Time Destination  1 : Uterus,Cervix, Bilateral Tubes, and Ovaries Tissue-Pathology Tissue PATHOLOGY - GENERAL / OTHER Raul Lorrene Pac, MD 10/21/2023 1230      IMPLANTS:  * No implants in log *    COMPLICATIONS: None  DISPOSITION: PACU - hemodynamically stable.  ATTESTATION:  FR- Surgery - (Entire alt) - I was present throughout the entire procedure as documented in my operative note (FR)

## 2023-10-23 ENCOUNTER — Other Ambulatory Visit (HOSPITAL_COMMUNITY): Payer: Self-pay

## 2023-11-06 ENCOUNTER — Other Ambulatory Visit (HOSPITAL_COMMUNITY): Payer: Self-pay

## 2023-11-06 ENCOUNTER — Other Ambulatory Visit: Payer: Self-pay | Admitting: Family Medicine

## 2023-11-06 DIAGNOSIS — I10 Essential (primary) hypertension: Secondary | ICD-10-CM

## 2023-11-08 ENCOUNTER — Other Ambulatory Visit (HOSPITAL_COMMUNITY): Payer: Self-pay

## 2023-11-08 MED ORDER — AMLODIPINE BESYLATE 5 MG PO TABS
5.0000 mg | ORAL_TABLET | Freq: Every day | ORAL | 2 refills | Status: DC
  Filled 2023-11-08: qty 30, 30d supply, fill #0
  Filled 2023-12-17: qty 30, 30d supply, fill #1
  Filled 2024-01-28: qty 30, 30d supply, fill #2

## 2023-11-11 ENCOUNTER — Other Ambulatory Visit (HOSPITAL_COMMUNITY): Payer: Self-pay

## 2023-11-29 ENCOUNTER — Other Ambulatory Visit (HOSPITAL_COMMUNITY): Payer: Self-pay

## 2023-11-29 MED ORDER — OXYBUTYNIN CHLORIDE ER 5 MG PO TB24
ORAL_TABLET | ORAL | 0 refills | Status: DC
  Filled 2023-11-29: qty 30, 30d supply, fill #0

## 2023-12-03 ENCOUNTER — Other Ambulatory Visit (HOSPITAL_COMMUNITY): Payer: Self-pay

## 2023-12-17 ENCOUNTER — Other Ambulatory Visit (HOSPITAL_COMMUNITY): Payer: Self-pay

## 2023-12-17 DIAGNOSIS — M5032 Other cervical disc degeneration, mid-cervical region, unspecified level: Secondary | ICD-10-CM | POA: Diagnosis not present

## 2023-12-17 DIAGNOSIS — M9902 Segmental and somatic dysfunction of thoracic region: Secondary | ICD-10-CM | POA: Diagnosis not present

## 2023-12-17 DIAGNOSIS — M62830 Muscle spasm of back: Secondary | ICD-10-CM | POA: Diagnosis not present

## 2023-12-17 DIAGNOSIS — M5134 Other intervertebral disc degeneration, thoracic region: Secondary | ICD-10-CM | POA: Diagnosis not present

## 2023-12-17 DIAGNOSIS — M9901 Segmental and somatic dysfunction of cervical region: Secondary | ICD-10-CM | POA: Diagnosis not present

## 2023-12-24 DIAGNOSIS — M5032 Other cervical disc degeneration, mid-cervical region, unspecified level: Secondary | ICD-10-CM | POA: Diagnosis not present

## 2023-12-24 DIAGNOSIS — M5134 Other intervertebral disc degeneration, thoracic region: Secondary | ICD-10-CM | POA: Diagnosis not present

## 2023-12-24 DIAGNOSIS — M9901 Segmental and somatic dysfunction of cervical region: Secondary | ICD-10-CM | POA: Diagnosis not present

## 2023-12-24 DIAGNOSIS — M9902 Segmental and somatic dysfunction of thoracic region: Secondary | ICD-10-CM | POA: Diagnosis not present

## 2023-12-24 DIAGNOSIS — M62830 Muscle spasm of back: Secondary | ICD-10-CM | POA: Diagnosis not present

## 2024-01-28 ENCOUNTER — Other Ambulatory Visit (HOSPITAL_COMMUNITY): Payer: Self-pay

## 2024-02-04 DIAGNOSIS — M5134 Other intervertebral disc degeneration, thoracic region: Secondary | ICD-10-CM | POA: Diagnosis not present

## 2024-02-04 DIAGNOSIS — M5032 Other cervical disc degeneration, mid-cervical region, unspecified level: Secondary | ICD-10-CM | POA: Diagnosis not present

## 2024-02-04 DIAGNOSIS — M9902 Segmental and somatic dysfunction of thoracic region: Secondary | ICD-10-CM | POA: Diagnosis not present

## 2024-02-04 DIAGNOSIS — M9901 Segmental and somatic dysfunction of cervical region: Secondary | ICD-10-CM | POA: Diagnosis not present

## 2024-02-04 DIAGNOSIS — M62830 Muscle spasm of back: Secondary | ICD-10-CM | POA: Diagnosis not present

## 2024-02-24 ENCOUNTER — Other Ambulatory Visit (HOSPITAL_COMMUNITY): Payer: Self-pay

## 2024-02-24 ENCOUNTER — Other Ambulatory Visit: Payer: Self-pay | Admitting: Family Medicine

## 2024-02-24 DIAGNOSIS — I10 Essential (primary) hypertension: Secondary | ICD-10-CM

## 2024-02-24 MED ORDER — AMLODIPINE BESYLATE 5 MG PO TABS
5.0000 mg | ORAL_TABLET | Freq: Every day | ORAL | 2 refills | Status: DC
  Filled 2024-02-24 – 2024-03-10 (×2): qty 90, 90d supply, fill #0
  Filled 2024-06-08: qty 90, 90d supply, fill #1
  Filled 2024-08-19: qty 90, 90d supply, fill #2

## 2024-03-06 ENCOUNTER — Other Ambulatory Visit (HOSPITAL_COMMUNITY): Payer: Self-pay

## 2024-03-10 ENCOUNTER — Other Ambulatory Visit (HOSPITAL_COMMUNITY): Payer: Self-pay

## 2024-04-06 ENCOUNTER — Encounter: Admitting: Family Medicine

## 2024-04-18 ENCOUNTER — Ambulatory Visit (INDEPENDENT_AMBULATORY_CARE_PROVIDER_SITE_OTHER): Admitting: Family Medicine

## 2024-04-18 ENCOUNTER — Encounter: Payer: Self-pay | Admitting: Family Medicine

## 2024-04-18 ENCOUNTER — Ambulatory Visit: Admitting: Gastroenterology

## 2024-04-18 VITALS — BP 117/73 | HR 83 | Ht 67.0 in | Wt 172.0 lb

## 2024-04-18 DIAGNOSIS — I10 Essential (primary) hypertension: Secondary | ICD-10-CM

## 2024-04-18 DIAGNOSIS — D509 Iron deficiency anemia, unspecified: Secondary | ICD-10-CM

## 2024-04-18 DIAGNOSIS — Z0000 Encounter for general adult medical examination without abnormal findings: Secondary | ICD-10-CM

## 2024-04-18 DIAGNOSIS — Z1211 Encounter for screening for malignant neoplasm of colon: Secondary | ICD-10-CM

## 2024-04-18 DIAGNOSIS — H04123 Dry eye syndrome of bilateral lacrimal glands: Secondary | ICD-10-CM | POA: Diagnosis not present

## 2024-04-18 DIAGNOSIS — H1045 Other chronic allergic conjunctivitis: Secondary | ICD-10-CM | POA: Diagnosis not present

## 2024-04-18 DIAGNOSIS — H0288B Meibomian gland dysfunction left eye, upper and lower eyelids: Secondary | ICD-10-CM | POA: Diagnosis not present

## 2024-04-18 DIAGNOSIS — H0288A Meibomian gland dysfunction right eye, upper and lower eyelids: Secondary | ICD-10-CM | POA: Diagnosis not present

## 2024-04-18 DIAGNOSIS — H35013 Changes in retinal vascular appearance, bilateral: Secondary | ICD-10-CM | POA: Diagnosis not present

## 2024-04-18 DIAGNOSIS — R635 Abnormal weight gain: Secondary | ICD-10-CM | POA: Diagnosis not present

## 2024-04-18 DIAGNOSIS — Z13220 Encounter for screening for lipoid disorders: Secondary | ICD-10-CM

## 2024-04-18 NOTE — Progress Notes (Signed)
 [name] - 53 y.o. female MRN [id number]  Date of birth: 1971/07/07  Subjective Chief Complaint  Patient presents with   Annual Exam    HPI Maddilynn IESHA Shields is a 53 y.o. female here today for annual exam.   She reports overall she is doing pretty well.  She is concerned about abnormal weight gain.  She denies any changes to her diet.  She is not very active.  She is a non-smoker.  Denies alcohol use.  Review of Systems  Constitutional:  Negative for chills, fever, malaise/fatigue and weight loss.  HENT:  Negative for congestion, ear pain and sore throat.   Eyes:  Negative for blurred vision, double vision and pain.  Respiratory:  Negative for cough and shortness of breath.   Cardiovascular:  Negative for chest pain and palpitations.  Gastrointestinal:  Negative for abdominal pain, blood in stool, constipation, heartburn and nausea.  Genitourinary:  Negative for dysuria and urgency.  Musculoskeletal:  Negative for joint pain and myalgias.  Neurological:  Negative for dizziness and headaches.  Endo/Heme/Allergies:  Does not bruise/bleed easily.  Psychiatric/Behavioral:  Negative for depression. The patient is not nervous/anxious and does not have insomnia.     No Known Allergies  Past Medical History:  Diagnosis Date   Abnormal Pap smear    Anemia    Endometriosis    Fibroids    Hypertension    Ovarian cyst     Past Surgical History:  Procedure Laterality Date    diagnostic laparscopic  06/2004   CHROMOPERTUBATION N/A 11/28/2013   Procedure: CHROMOPERTUBATION, ;  Surgeon: Camillo Celestine, MD;  Location: [facility];  Service: Gynecology;  Laterality: N/A;   DIAGNOSTIC LAPAROSCOPY  2005   [place]    DILATATION & CURETTAGE/HYSTEROSCOPY WITH MYOSURE N/A 12/30/2018   Procedure: DILATATION & CURETTAGE/HYSTEROSCOPY WITH MYOSURE;  Surgeon: Meriam Stamp, MD;  Location: [facility];  Service: Gynecology;  Laterality: N/A;   HYSTERECTOMY, TOTAL, LAPAROSCOPIC,  ROBOT-ASSISTED WITH SALPINGECTOMY N/A    HYSTEROSCOPY WITH NOVASURE N/A 12/30/2018   Procedure: HYSTEROSCOPY WITH NOVASURE, POSSIBLE HYDROTHERMAL ABLATION;  Surgeon: Meriam Stamp, MD;  Location: [facility];  Service: Gynecology;  Laterality: N/A;   OVARIAN CYST REMOVAL  2015   ROBOTIC ASSISTED LAPAROSCOPIC LYSIS OF ADHESION N/A 11/28/2013   Procedure: ROBOTIC ASSISTED LAPAROSCOPIC LYSIS OF ADHESION; EXCISION OF RIGHT OVARIAN CYST WALL AND MYOMECTOMY;  Surgeon: Camillo Celestine, MD;  Location: [facility];  Service: Gynecology;  Laterality: N/A;   UTERINE FIBROID SURGERY  2015   WISDOM TOOTH EXTRACTION      Social History   Socioeconomic History   Marital status: Married    Spouse name: Not on file   Number of children: Not on file   Years of education: Not on file   Highest education level: Not on file  Occupational History   Not on file  Tobacco Use   Smoking status: Never   Smokeless tobacco: Never  Vaping Use   Vaping status: Never Used  Substance and Sexual Activity   Alcohol use: No   Drug use: No   Sexual activity: Yes    Birth control/protection: None  Other Topics Concern   Not on file  Social History Narrative   Not on file   Social Drivers of Health   Financial Resource Strain: Not on file  Food Insecurity: Not on file  Transportation Needs: Not on file  Physical Activity: Not on file  Stress: Not on file  Social Connections: Not on file  Family History  Problem Relation Age of Onset   Hypertension Mother    Stroke Mother    Hypertension Father    Heart disease Father    Stroke Maternal Grandfather    Hypertension Maternal Grandfather    Cancer Paternal Grandmother        stomach cancer   Hyperlipidemia Paternal Grandmother     Health Maintenance  Topic Date Due   DTaP/Tdap/Td (1 - Tdap) Never done   Cervical Cancer Screening (HPV/Pap Cotest)  Never done   Zoster Vaccines- Shingrix  (2 of 2) 09/16/2022   Fecal DNA (Cologuard)  01/16/2024    Hepatitis C Screening  07/26/2024 (Originally 04/06/1989)   HIV Screening  07/26/2024 (Originally 04/06/1986)   COVID-19 Vaccine (3 - Pfizer risk series) 05/04/2025 (Originally 03/08/2020)   INFLUENZA VACCINE  06/30/2024   MAMMOGRAM  07/10/2024   HPV VACCINES  Aged Out   Meningococcal B Vaccine  Aged Out     ----------------------------------------------------------------------------------------------------------------------------------------------------------------------------------------------------------------- Physical Exam BP 117/73 (BP Location: Right Arm, Patient Position: Sitting, Cuff Size: Normal)   Pulse 83   Ht 5\' 7"  (1.702 m)   Wt 172 lb (78 kg)   LMP 10/31/2019 (Within Weeks) Comment: on Lupron   SpO2 99%   BMI 26.94 kg/m   Physical Exam Constitutional:      General: She is not in acute distress. HENT:     Head: Normocephalic and atraumatic.     Right Ear: Tympanic membrane and ear canal normal.     Left Ear: Tympanic membrane and ear canal normal.     Nose: Nose normal.  Eyes:     General: No scleral icterus.    Conjunctiva/sclera: Conjunctivae normal.  Neck:     Thyroid : No thyromegaly.  Cardiovascular:     Rate and Rhythm: Normal rate and regular rhythm.     Heart sounds: Normal heart sounds.  Pulmonary:     Effort: Pulmonary effort is normal.     Breath sounds: Normal breath sounds.  Abdominal:     General: Bowel sounds are normal. There is no distension.     Palpations: Abdomen is soft.     Tenderness: There is no abdominal tenderness. There is no guarding.  Musculoskeletal:        General: Normal range of motion.     Cervical back: Normal range of motion and neck supple.  Lymphadenopathy:     Cervical: No cervical adenopathy.  Skin:    General: Skin is warm and dry.     Findings: No rash.  Neurological:     General: No focal deficit present.     Mental Status: She is alert and oriented to person, place, and time.     Cranial Nerves: No cranial nerve  deficit.     Coordination: Coordination normal.  Psychiatric:        Mood and Affect: Mood normal.        Behavior: Behavior normal.     ------------------------------------------------------------------------------------------------------------------------------------------------------------------------------------------------------------------- Assessment and Plan  Well adult exam Well adult Orders Placed This Encounter  Procedures   CMP14+EGFR   CBC with Differential/Platelet   Lipid Panel With LDL/HDL Ratio   TSH   HgB L3J   Fe+TIBC+Fer   Cologuard  Screenings: Per lab orders.   Immunizations: Up-to-date Anticipatory guidance/risk factor reduction: Recommendations per AVS.   No orders of the defined types were placed in this encounter.   No follow-ups on file.

## 2024-04-18 NOTE — Assessment & Plan Note (Signed)
 Well adult Orders Placed This Encounter  Procedures   CMP14+EGFR   CBC with Differential/Platelet   Lipid Panel With LDL/HDL Ratio   TSH   HgB U4Q   Fe+TIBC+Fer   Cologuard  Screenings: Per lab orders.   Immunizations: Up-to-date Anticipatory guidance/risk factor reduction: Recommendations per AVS.

## 2024-04-18 NOTE — Patient Instructions (Signed)
 Preventive Care 16-53 Years Old, Female  Preventive care refers to lifestyle choices and visits with your health care provider that can promote health and wellness. Preventive care visits are also called wellness exams.  What can I expect for my preventive care visit?  Counseling  Your health care provider may ask you questions about your:  Medical history, including:  Past medical problems.  Family medical history.  Pregnancy history.  Current health, including:  Menstrual cycle.  Method of birth control.  Emotional well-being.  Home life and relationship well-being.  Sexual activity and sexual health.  Lifestyle, including:  Alcohol, nicotine or tobacco, and drug use.  Access to firearms.  Diet, exercise, and sleep habits.  Work and work Astronomer.  Sunscreen use.  Safety issues such as seatbelt and bike helmet use.  Physical exam  Your health care provider will check your:  Height and weight. These may be used to calculate your BMI (body mass index). BMI is a measurement that tells if you are at a healthy weight.  Waist circumference. This measures the distance around your waistline. This measurement also tells if you are at a healthy weight and may help predict your risk of certain diseases, such as type 2 diabetes and high blood pressure.  Heart rate and blood pressure.  Body temperature.  Skin for abnormal spots.  What immunizations do I need?    Vaccines are usually given at various ages, according to a schedule. Your health care provider will recommend vaccines for you based on your age, medical history, and lifestyle or other factors, such as travel or where you work.  What tests do I need?  Screening  Your health care provider may recommend screening tests for certain conditions. This may include:  Lipid and cholesterol levels.  Diabetes screening. This is done by checking your blood sugar (glucose) after you have not eaten for a while (fasting).  Pelvic exam and Pap test.  Hepatitis B test.  Hepatitis C  test.  HIV (human immunodeficiency virus) test.  STI (sexually transmitted infection) testing, if you are at risk.  Lung cancer screening.  Colorectal cancer screening.  Mammogram. Talk with your health care provider about when you should start having regular mammograms. This may depend on whether you have a family history of breast cancer.  BRCA-related cancer screening. This may be done if you have a family history of breast, ovarian, tubal, or peritoneal cancers.  Bone density scan. This is done to screen for osteoporosis.  Talk with your health care provider about your test results, treatment options, and if necessary, the need for more tests.  Follow these instructions at home:  Eating and drinking    Eat a diet that includes fresh fruits and vegetables, whole grains, lean protein, and low-fat dairy products.  Take vitamin and mineral supplements as recommended by your health care provider.  Do not drink alcohol if:  Your health care provider tells you not to drink.  You are pregnant, may be pregnant, or are planning to become pregnant.  If you drink alcohol:  Limit how much you have to 0-1 drink a day.  Know how much alcohol is in your drink. In the [place] one drink equals one 12 oz bottle of beer (355 mL), one 5 oz glass of wine (148 mL), or one 1 oz glass of hard liquor (44 mL).  Lifestyle  Brush your teeth every morning and night with fluoride toothpaste. Floss one time each day.  Exercise for at least  30 minutes 5 or more days each week.  Do not use any products that contain nicotine or tobacco. These products include cigarettes, chewing tobacco, and vaping devices, such as e-cigarettes. If you need help quitting, ask your health care provider.  Do not use drugs.  If you are sexually active, practice safe sex. Use a condom or other form of protection to prevent STIs.  If you do not wish to become pregnant, use a form of birth control. If you plan to become pregnant, see your health care provider for a  prepregnancy visit.  Take aspirin only as told by your health care provider. Make sure that you understand how much to take and what form to take. Work with your health care provider to find out whether it is safe and beneficial for you to take aspirin daily.  Find healthy ways to manage stress, such as:  Meditation, yoga, or listening to music.  Journaling.  Talking to a trusted person.  Spending time with friends and family.  Minimize exposure to UV radiation to reduce your risk of skin cancer.  Safety  Always wear your seat belt while driving or riding in a vehicle.  Do not drive:  If you have been drinking alcohol. Do not ride with someone who has been drinking.  When you are tired or distracted.  While texting.  If you have been using any mind-altering substances or drugs.  Wear a helmet and other protective equipment during sports activities.  If you have firearms in your house, make sure you follow all gun safety procedures.  Seek help if you have been physically or sexually abused.  What's next?  Visit your health care provider once a year for an annual wellness visit.  Ask your health care provider how often you should have your eyes and teeth checked.  Stay up to date on all vaccines.  This information is not intended to replace advice given to you by your health care provider. Make sure you discuss any questions you have with your health care provider.  Document Revised: 05/14/2021 Document Reviewed: 05/14/2021  Elsevier Patient Education  2024 [employer].

## 2024-04-19 ENCOUNTER — Encounter: Payer: Self-pay | Admitting: Family Medicine

## 2024-04-19 LAB — CBC WITH DIFFERENTIAL/PLATELET
Basophils Absolute: 0 10*3/uL (ref 0.0–0.2)
Basos: 0 %
EOS (ABSOLUTE): 0.1 10*3/uL (ref 0.0–0.4)
Eos: 2 %
Hematocrit: 44.6 % (ref 34.0–46.6)
Hemoglobin: 14.1 g/dL (ref 11.1–15.9)
Immature Grans (Abs): 0 10*3/uL (ref 0.0–0.1)
Immature Granulocytes: 0 %
Lymphocytes Absolute: 2.5 10*3/uL (ref 0.7–3.1)
Lymphs: 39 %
MCH: 27.9 pg (ref 26.6–33.0)
MCHC: 31.6 g/dL (ref 31.5–35.7)
MCV: 88 fL (ref 79–97)
Monocytes Absolute: 0.3 10*3/uL (ref 0.1–0.9)
Monocytes: 4 %
Neutrophils Absolute: 3.5 10*3/uL (ref 1.4–7.0)
Neutrophils: 55 %
Platelets: 277 10*3/uL (ref 150–450)
RBC: 5.06 x10E6/uL (ref 3.77–5.28)
RDW: 16.5 % — ABNORMAL HIGH (ref 11.7–15.4)
WBC: 6.4 10*3/uL (ref 3.4–10.8)

## 2024-04-19 LAB — LIPID PANEL WITH LDL/HDL RATIO
Cholesterol, Total: 202 mg/dL — ABNORMAL HIGH (ref 100–199)
HDL: 56 mg/dL (ref >39)
LDL Chol Calc (NIH): 130 mg/dL — ABNORMAL HIGH (ref 0–99)
LDL/HDL Ratio: 2.3 ratio (ref 0.0–3.2)
Triglycerides: 89 mg/dL (ref 0–149)
VLDL Cholesterol Cal: 16 mg/dL (ref 5–40)

## 2024-04-19 LAB — CMP14+EGFR
ALT: 24 IU/L (ref 0–32)
AST: 22 IU/L (ref 0–40)
Albumin: 4.3 g/dL (ref 3.8–4.9)
Alkaline Phosphatase: 123 IU/L — ABNORMAL HIGH (ref 44–121)
BUN/Creatinine Ratio: 15 (ref 9–23)
BUN: 9 mg/dL (ref 6–24)
Bilirubin Total: 0.4 mg/dL (ref 0.0–1.2)
CO2: 23 mmol/L (ref 20–29)
Calcium: 9.5 mg/dL (ref 8.7–10.2)
Chloride: 102 mmol/L (ref 96–106)
Creatinine, Ser: 0.62 mg/dL (ref 0.57–1.00)
Globulin, Total: 2.6 g/dL (ref 1.5–4.5)
Glucose: 100 mg/dL — ABNORMAL HIGH (ref 70–99)
Potassium: 3.8 mmol/L (ref 3.5–5.2)
Sodium: 140 mmol/L (ref 134–144)
Total Protein: 6.9 g/dL (ref 6.0–8.5)
eGFR: 106 mL/min/{1.73_m2} (ref >59)

## 2024-04-19 LAB — TSH: TSH: 1.62 u[IU]/mL (ref 0.450–4.500)

## 2024-04-19 LAB — IRON,TIBC AND FERRITIN PANEL
Ferritin: 43 ng/mL (ref 15–150)
Iron Saturation: 24 % (ref 15–55)
Iron: 91 ug/dL (ref 27–159)
Total Iron Binding Capacity: 378 ug/dL (ref 250–450)
UIBC: 287 ug/dL (ref 131–425)

## 2024-04-19 LAB — HEMOGLOBIN A1C
Est. average glucose Bld gHb Est-mCnc: 120 mg/dL
Hgb A1c MFr Bld: 5.8 % — ABNORMAL HIGH (ref 4.8–5.6)

## 2024-04-28 ENCOUNTER — Ambulatory Visit: Payer: Self-pay | Admitting: Family Medicine

## 2024-05-12 DIAGNOSIS — Z1211 Encounter for screening for malignant neoplasm of colon: Secondary | ICD-10-CM | POA: Diagnosis not present

## 2024-05-18 LAB — COLOGUARD: COLOGUARD: NEGATIVE

## 2024-06-08 ENCOUNTER — Other Ambulatory Visit (HOSPITAL_COMMUNITY): Payer: Self-pay

## 2024-06-09 ENCOUNTER — Encounter (HOSPITAL_COMMUNITY): Payer: Self-pay

## 2024-06-09 ENCOUNTER — Other Ambulatory Visit (HOSPITAL_COMMUNITY): Payer: Self-pay

## 2024-06-09 ENCOUNTER — Other Ambulatory Visit: Payer: Self-pay

## 2024-06-09 DIAGNOSIS — R300 Dysuria: Secondary | ICD-10-CM | POA: Diagnosis not present

## 2024-06-09 DIAGNOSIS — N951 Menopausal and female climacteric states: Secondary | ICD-10-CM | POA: Diagnosis not present

## 2024-06-09 MED ORDER — PHENAZOPYRIDINE HCL 200 MG PO TABS
200.0000 mg | ORAL_TABLET | Freq: Three times a day (TID) | ORAL | 0 refills | Status: DC
  Filled 2024-06-09: qty 6, 2d supply, fill #0

## 2024-06-09 MED ORDER — ESTRADIOL 0.75 MG/0.75GM TD GEL
1.0000 | Freq: Every day | TRANSDERMAL | 2 refills | Status: DC
  Filled 2024-06-09: qty 30, 30d supply, fill #0

## 2024-06-09 MED ORDER — SULFAMETHOXAZOLE-TRIMETHOPRIM 800-160 MG PO TABS
1.0000 | ORAL_TABLET | Freq: Two times a day (BID) | ORAL | 0 refills | Status: DC
  Filled 2024-06-09: qty 6, 3d supply, fill #0

## 2024-06-12 ENCOUNTER — Other Ambulatory Visit (HOSPITAL_COMMUNITY): Payer: Self-pay

## 2024-06-12 MED ORDER — ESTRADIOL 0.075 MG/24HR TD PTTW
1.0000 | MEDICATED_PATCH | TRANSDERMAL | 0 refills | Status: DC
  Filled 2024-06-12: qty 24, 84d supply, fill #0

## 2024-06-22 ENCOUNTER — Other Ambulatory Visit (HOSPITAL_COMMUNITY): Payer: Self-pay

## 2024-07-05 ENCOUNTER — Ambulatory Visit: Admitting: Gastroenterology

## 2024-07-05 ENCOUNTER — Other Ambulatory Visit (HOSPITAL_COMMUNITY): Payer: Self-pay

## 2024-07-05 ENCOUNTER — Other Ambulatory Visit (HOSPITAL_BASED_OUTPATIENT_CLINIC_OR_DEPARTMENT_OTHER): Payer: Self-pay

## 2024-07-05 ENCOUNTER — Encounter: Payer: Self-pay | Admitting: Gastroenterology

## 2024-07-05 VITALS — BP 118/72 | HR 71 | Ht 67.0 in | Wt 173.0 lb

## 2024-07-05 DIAGNOSIS — K5909 Other constipation: Secondary | ICD-10-CM | POA: Diagnosis not present

## 2024-07-05 MED ORDER — PEG 3350-KCL-NA BICARB-NACL 420 G PO SOLR
4000.0000 mL | Freq: Once | ORAL | 0 refills | Status: AC
  Filled 2024-07-05: qty 4000, 1d supply, fill #0

## 2024-07-05 NOTE — Progress Notes (Signed)
 [facility] Consult Note:  History: [name] 07/05/2024  Referring provider: Alvia Bring, DO  Reason for consult/chief complaint: change in bowel habits (Pt state she been having some problems with constipation, pt states when she had her hysterectomy they had to push her colon back because it was in front of everything, pt is here to discuss having an colonoscopy)   Subjective  HPI: One prior visit here in April 2022 (seen by APP, supervised by Dr. Teressa) -evaluating noncardiac chest pain and gas/flatulence.  Negative Cologuard February 2022  Lap total HYSTERECTOMY/BSO Nov 2024 - for fibroids Op note indicates extensive LOA due to endometriosis with sigmoid adhesions  ________  This is a very pleasant 53 year old woman accompanied by her mother here to see me today for chronic constipation.  She has had constipation for many years, typically with a BM every 1 to 2 weeks.  This worsened after getting her hysterectomy last fall.  She was concerned because of how the gynecologist told her her sigmoid colon needed to be moved out of the way for them to perform the hysterectomy. When she does not have a BM for a week or more, she does not have abdominal pain or otherwise feel unwell.  When the [facility] finally occurs it is quite large and will take a while.  Denies rectal bleeding nausea or vomiting.  She still tends to have flatulence at times.    ROS:  Review of Systems  Constitutional:  Negative for appetite change and unexpected weight change.  HENT:  Negative for mouth sores and voice change.   Eyes:  Negative for pain and redness.  Respiratory:  Negative for cough and shortness of breath.   Cardiovascular:  Negative for chest pain and palpitations.  Genitourinary:  Negative for dysuria and hematuria.  Musculoskeletal:  Negative for arthralgias and myalgias.  Skin:  Negative for pallor and rash.  Neurological:  Negative for weakness and headaches.   Hematological:  Negative for adenopathy.     Past Medical History: Past Medical History:  Diagnosis Date   Abnormal Pap smear    Anemia    Endometriosis    Fibroids    Hypertension    Ovarian cyst      Past Surgical History: Past Surgical History:  Procedure Laterality Date    diagnostic laparscopic  06/2004   CHROMOPERTUBATION N/A 11/28/2013   Procedure: CHROMOPERTUBATION, ;  Surgeon: Charlie JINNY Flowers, MD;  Location: [facility];  Service: Gynecology;  Laterality: N/A;   DIAGNOSTIC LAPAROSCOPY  2005   [place]    DILATATION & CURETTAGE/HYSTEROSCOPY WITH MYOSURE N/A 12/30/2018   Procedure: DILATATION & CURETTAGE/HYSTEROSCOPY WITH MYOSURE;  Surgeon: Flowers Charlie, MD;  Location: [facility];  Service: Gynecology;  Laterality: N/A;   HYSTERECTOMY, TOTAL, LAPAROSCOPIC, ROBOT-ASSISTED WITH SALPINGECTOMY N/A    HYSTEROSCOPY WITH NOVASURE N/A 12/30/2018   Procedure: HYSTEROSCOPY WITH NOVASURE, POSSIBLE HYDROTHERMAL ABLATION;  Surgeon: Flowers Charlie, MD;  Location: [facility];  Service: Gynecology;  Laterality: N/A;   OVARIAN CYST REMOVAL  2015   ROBOTIC ASSISTED LAPAROSCOPIC LYSIS OF ADHESION N/A 11/28/2013   Procedure: ROBOTIC ASSISTED LAPAROSCOPIC LYSIS OF ADHESION; EXCISION OF RIGHT OVARIAN CYST WALL AND MYOMECTOMY;  Surgeon: Charlie JINNY Flowers, MD;  Location: [facility];  Service: Gynecology;  Laterality: N/A;   UTERINE FIBROID SURGERY  2015   WISDOM TOOTH EXTRACTION       Family History: Family History  Problem Relation Age of Onset   Hypertension Mother    Stroke Mother  Hypertension Father    Heart disease Father    Stroke Maternal Grandfather    Hypertension Maternal Grandfather    Cancer Paternal Grandmother        stomach cancer   Hyperlipidemia Paternal Grandmother     Social History: Social History   Socioeconomic History   Marital status: Married    Spouse name: Not on file   Number of children: Not on file   Years of education: Not on file   Highest education  level: Not on file  Occupational History   Not on file  Tobacco Use   Smoking status: Never   Smokeless tobacco: Never  Vaping Use   Vaping status: Never Used  Substance and Sexual Activity   Alcohol use: No   Drug use: No   Sexual activity: Yes    Birth control/protection: None  Other Topics Concern   Not on file  Social History Narrative   Not on file   Social Drivers of Health   Financial Resource Strain: Not on file  Food Insecurity: Not on file  Transportation Needs: Not on file  Physical Activity: Not on file  Stress: Not on file  Social Connections: Not on file    Allergies: No Known Allergies  Outpatient Meds: Current Outpatient Medications  Medication Sig Dispense Refill   amLODipine  (NORVASC ) 5 MG tablet Take 1 tablet (5 mg total) by mouth daily. 90 tablet 2   polyethylene glycol-electrolytes (NULYTELY) 420 g solution Take 4,000 mLs by mouth once for 1 dose. 4000 mL 0   estradiol  (VIVELLE -DOT) 0.05 MG/24HR patch Place 1 patch (0.05 mg total) onto the skin 2 (two) times a week. (Patient not taking: Reported on 07/05/2024) 24 patch 3   estradiol  (VIVELLE -DOT) 0.075 MG/24HR Place 1 patch onto the skin 2 (two) times a week for 84 days. (Patient not taking: Reported on 07/05/2024) 24 patch 0   No current facility-administered medications for this visit.      ___________________________________________________________________ Objective   Exam:  BP 118/72   Pulse 71   Ht 5' 7 (1.702 m)   Wt 173 lb (78.5 kg)   LMP 10/31/2019 (Within Weeks) Comment: on Lupron   BMI 27.10 kg/m  Wt Readings from Last 3 Encounters:  07/05/24 173 lb (78.5 kg)  04/18/24 172 lb (78 kg)  07/27/23 154 lb (69.9 kg)    General: Well-appearing Eyes: sclera anicteric, no redness ENT: oral mucosa moist without lesions, no cervical or supraclavicular lymphadenopathy CV: Regular without appreciable murmur, no JVD, no peripheral edema Resp: clear to auscultation bilaterally, normal RR  and effort noted GI: soft, no tenderness, with active bowel sounds. No guarding or palpable organomegaly noted. Skin; warm and dry, no rash or jaundice noted Neuro: awake, alert and oriented x 3. Normal gross motor function and fluent speech  Labs:  Repeat Cologuard - June 2025    Assessment: Encounter Diagnosis  Name Primary?   Chronic constipation Yes    Some endometriosis related adhesions were found at the time of pelvic surgery and I wonder if she may also have some redundancy to the colonic anatomy accounting for her chronic constipation.  There is also probably a motility related cause rather than obstruction. While she had a negative Cologuard, she was concerned about this longstanding constipation and what she was told about her colon by the gynecologist.  Tamela is requesting a colonoscopy for further evaluation, and I agree that makes sense with the symptoms she is reporting.  Cologuard also has false negatives when  it comes to precancerous polyps.  She was agreeable to colonoscopy after discussion of procedure and risks.  The benefits and risks of the planned procedure(s) were described in detail with the patient or (when appropriate) their health care proxy.  Risks were outlined as including, but not limited to, bleeding, infection, perforation, adverse medication reaction leading to cardiac or pulmonary decompensation, pancreatitis (if ERCP).  The limitation of incomplete mucosal visualization was also discussed.  No guarantees or warranties were given.  Recommended that in addition to the current daily stool softener she is taking that she also take the 400 mg over-the-counter magnesium capsule every night.  For sufficient bowel preparation prior to procedure in the setting of severe constipation, she will be given a GoLytely bowel preparation and was advised to take packet of MiraLAX in a glass of water  once daily for the 3 days leading up to prep day.   Thank you for  the courtesy of this consult.  Please call me with any questions or concerns.  Victory LITTIE Brand III  CC: Referring provider noted above

## 2024-07-05 NOTE — Patient Instructions (Signed)
 You have been scheduled for a colonoscopy. Please follow written instructions given to you at your visit today.   If you use inhalers (even only as needed), please bring them with you on the day of your procedure.  DO NOT TAKE 7 DAYS PRIOR TO TEST- Trulicity (dulaglutide) Ozempic, Wegovy (semaglutide) Mounjaro (tirzepatide) Bydureon Bcise (exanatide extended release)  DO NOT TAKE 1 DAY PRIOR TO YOUR TEST Rybelsus (semaglutide) Adlyxin (lixisenatide) Victoza (liraglutide) Byetta (exanatide) ___________________________________________________________________________   _______________________________________________________  If your blood pressure at your visit was 140/90 or greater, please contact your primary care physician to follow up on this.  _______________________________________________________  If you are age 35 or older, your body mass index should be between 23-30. Your Body mass index is 27.1 kg/m. If this is out of the aforementioned range listed, please consider follow up with your Primary Care Provider.  If you are age 4 or younger, your body mass index should be between 19-25. Your Body mass index is 27.1 kg/m. If this is out of the aformentioned range listed, please consider follow up with your Primary Care Provider.   ________________________________________________________  The [facility] providers would like to encourage you to use MYCHART to communicate with providers for non-urgent requests or questions.  Due to long hold times on the telephone, sending your provider a message by [facility] may be a faster and more efficient way to get a response.  Please allow 48 business hours for a response.  Please remember that this is for non-urgent requests.  _______________________________________________________  [facility] is using a team-based approach to care.  Your team is made up of your doctor and two to three APPS. Our APPS (Nurse Practitioners and  Physician Assistants) work with your physician to ensure care continuity for you. They are fully qualified to address your health concerns and develop a treatment plan. They communicate directly with your gastroenterologist to care for you. Seeing the Advanced Practice Practitioners on your physician's team can help you by facilitating care more promptly, often allowing for earlier appointments, access to diagnostic testing, procedures, and other specialty referrals.   Thank you for trusting me with your gastrointestinal care!    Dr. Victory Legrand [facility]

## 2024-07-07 ENCOUNTER — Other Ambulatory Visit (HOSPITAL_COMMUNITY): Payer: Self-pay

## 2024-07-08 ENCOUNTER — Other Ambulatory Visit (HOSPITAL_COMMUNITY): Payer: Self-pay

## 2024-07-10 ENCOUNTER — Other Ambulatory Visit (HOSPITAL_COMMUNITY): Payer: Self-pay

## 2024-07-10 MED ORDER — ESTRADIOL 0.75 MG/0.75GM TD GEL
0.7500 mg | Freq: Every day | TRANSDERMAL | 2 refills | Status: DC
  Filled 2024-07-10: qty 30, 30d supply, fill #0
  Filled 2024-08-19: qty 30, 30d supply, fill #1

## 2024-07-11 ENCOUNTER — Other Ambulatory Visit (HOSPITAL_COMMUNITY): Payer: Self-pay

## 2024-07-14 ENCOUNTER — Other Ambulatory Visit (HOSPITAL_COMMUNITY): Payer: Self-pay

## 2024-07-18 ENCOUNTER — Other Ambulatory Visit (HOSPITAL_COMMUNITY): Payer: Self-pay

## 2024-07-26 ENCOUNTER — Other Ambulatory Visit (HOSPITAL_COMMUNITY): Payer: Self-pay

## 2024-08-10 ENCOUNTER — Ambulatory Visit (AMBULATORY_SURGERY_CENTER): Admitting: Gastroenterology

## 2024-08-10 ENCOUNTER — Encounter: Payer: Self-pay | Admitting: Gastroenterology

## 2024-08-10 VITALS — BP 111/67 | HR 103 | Temp 97.2°F | Resp 18 | Ht 67.0 in | Wt 173.0 lb

## 2024-08-10 DIAGNOSIS — I10 Essential (primary) hypertension: Secondary | ICD-10-CM | POA: Diagnosis not present

## 2024-08-10 DIAGNOSIS — K5909 Other constipation: Secondary | ICD-10-CM

## 2024-08-10 DIAGNOSIS — Q438 Other specified congenital malformations of intestine: Secondary | ICD-10-CM | POA: Diagnosis not present

## 2024-08-10 MED ORDER — SODIUM CHLORIDE 0.9 % IV SOLN: 500.0000 mL | Freq: Once | INTRAVENOUS | Status: DC

## 2024-08-10 NOTE — Progress Notes (Signed)
 History and Physical:  This patient presents for endoscopic testing for: Encounter Diagnosis  Name Primary?   Chronic constipation Yes    53 year old woman here today for evaluation of chronic constipation.  Clinical details of this are outlined in my office consult note from 07/05/2024, with no significant clinical changes since then.  Patient is otherwise without complaints or active issues today.   Past Medical History: Past Medical History:  Diagnosis Date   Abnormal Pap smear    Anemia    Endometriosis    Fibroids    Hypertension    Ovarian cyst      Past Surgical History: Past Surgical History:  Procedure Laterality Date    diagnostic laparscopic  06/2004   CHROMOPERTUBATION N/A 11/28/2013   Procedure: CHROMOPERTUBATION, ;  Surgeon: Charlie JINNY Flowers, MD;  Location: [facility];  Service: Gynecology;  Laterality: N/A;   DIAGNOSTIC LAPAROSCOPY  2005   [place]    DILATATION & CURETTAGE/HYSTEROSCOPY WITH MYOSURE N/A 12/30/2018   Procedure: DILATATION & CURETTAGE/HYSTEROSCOPY WITH MYOSURE;  Surgeon: Flowers Charlie, MD;  Location: [facility];  Service: Gynecology;  Laterality: N/A;   HYSTERECTOMY, TOTAL, LAPAROSCOPIC, ROBOT-ASSISTED WITH SALPINGECTOMY N/A    HYSTEROSCOPY WITH NOVASURE N/A 12/30/2018   Procedure: HYSTEROSCOPY WITH NOVASURE, POSSIBLE HYDROTHERMAL ABLATION;  Surgeon: Flowers Charlie, MD;  Location: [facility];  Service: Gynecology;  Laterality: N/A;   OVARIAN CYST REMOVAL  2015   ROBOTIC ASSISTED LAPAROSCOPIC LYSIS OF ADHESION N/A 11/28/2013   Procedure: ROBOTIC ASSISTED LAPAROSCOPIC LYSIS OF ADHESION; EXCISION OF RIGHT OVARIAN CYST WALL AND MYOMECTOMY;  Surgeon: Charlie JINNY Flowers, MD;  Location: [facility];  Service: Gynecology;  Laterality: N/A;   UTERINE FIBROID SURGERY  2015   WISDOM TOOTH EXTRACTION      Allergies: No Known Allergies  Outpatient Meds: Current Outpatient Medications  Medication Sig Dispense Refill   amLODipine  (NORVASC ) 5 MG tablet Take 1 tablet (5  mg total) by mouth daily. 90 tablet 2   Estradiol  (DIVIGEL ) 0.75 MG/0.75GM GEL Place 0.75 mg onto the skin daily. 30 each 2   estradiol  (VIVELLE -DOT) 0.05 MG/24HR patch Place 1 patch (0.05 mg total) onto the skin 2 (two) times a week. (Patient not taking: No sig reported) 24 patch 3   estradiol  (VIVELLE -DOT) 0.075 MG/24HR Place 1 patch onto the skin 2 (two) times a week for 84 days. (Patient not taking: No sig reported) 24 patch 0   Current Facility-Administered Medications  Medication Dose Route Frequency Provider Last Rate Last Admin   0.9 %  sodium chloride  infusion  500 mL Intravenous Once Danis, Deveion Denz L III, MD          ___________________________________________________________________ Objective   Exam:  BP 127/83   Pulse 79   Temp (!) 97.2 F (36.2 C) (Temporal)   Resp 18   Ht 5' 7 (1.702 m)   Wt 173 lb (78.5 kg)   LMP 10/31/2019 (Within Weeks) Comment: on Lupron   SpO2 100%   BMI 27.10 kg/m   CV: regular , S1/S2 Resp: clear to auscultation bilaterally, normal RR and effort noted GI: soft, no tenderness, with active bowel sounds.   Assessment: Encounter Diagnosis  Name Primary?   Chronic constipation Yes     Plan: Colonoscopy   The benefits and risks of the planned procedure(s) were described in detail with the patient or (when appropriate) their health care proxy.  Risks were outlined as including, but not limited to, bleeding, infection, perforation, adverse medication reaction leading to cardiac or pulmonary decompensation, pancreatitis (if ERCP).  The limitation of incomplete mucosal visualization was also discussed.  No guarantees or warranties were given.  The patient is appropriate for an endoscopic procedure in the ambulatory setting.   - Victory Brand, MD

## 2024-08-10 NOTE — Op Note (Signed)
 [facility] Patient Name: [name] Procedure Date: 08/10/2024 3:46 PM MRN: [id number] Endoscopist: Victory L. Legrand , MD, [id number] Age: 53 Referring MD:  Date of Birth: 11-27-1971 Gender: Female Account #: [id number] Procedure:                Colonoscopy Indications:              Constipation Medicines:                Monitored Anesthesia Care Procedure:                Pre-Anesthesia Assessment:                           - Prior to the procedure, a History and Physical                            was performed, and patient medications and                            allergies were reviewed. The patient's tolerance of                            previous anesthesia was also reviewed. The risks                            and benefits of the procedure and the sedation                            options and risks were discussed with the patient.                            All questions were answered, and informed consent                            was obtained. Prior Anticoagulants: The patient has                            taken no anticoagulant or antiplatelet agents. ASA                            Grade Assessment: II - A patient with mild systemic                            disease. After reviewing the risks and benefits,                            the patient was deemed in satisfactory condition to                            undergo the procedure.                           After obtaining informed consent, the colonoscope  was passed under direct vision. Throughout the                            procedure, the patient's blood pressure, pulse, and                            oxygen saturations were monitored continuously. The                            Olympus Scope [id number] was introduced through the                            anus and advanced to the the cecum, identified by                            appendiceal orifice and ileocecal valve.  The                            colonoscopy was performed without difficulty. The                            patient tolerated the procedure well. The quality                            of the bowel preparation was excellent. The                            ileocecal valve, appendiceal orifice, and rectum                            were photographed. The bowel preparation used was                            GoLYTELY. Scope In: 4:09:11 PM Scope Out: 4:23:56 PM Scope Withdrawal Time: 0 hours 11 minutes 2 seconds  Total Procedure Duration: 0 hours 14 minutes 45 seconds  Findings:                 The perianal and digital rectal examinations were                            normal.                           Repeat examination of right colon under NBI                            performed.                           The sigmoid colon was mildly redundant.                           The exam was otherwise without abnormality on  direct and retroflexion views. Complications:            No immediate complications. Estimated Blood Loss:     Estimated blood loss: none. Impression:               - Redundant colon.                           - The examination was otherwise normal on direct                            and retroflexion views.                           - No specimens collected. Recommendation:           - Patient has a contact number available for                            emergencies. The signs and symptoms of potential                            delayed complications were discussed with the                            patient. Return to normal activities tomorrow.                            Written discharge instructions were provided to the                            patient.                           - Resume previous diet.                           - Continue present medications.                           - Repeat colonoscopy in 10 years for screening                             purposes.                           - Review constipation treatment recommendations and                            most recent office note and contact as needed. Jun Rightmyer L. Legrand, MD 08/10/2024 4:30:32 PM This report has been signed electronically.

## 2024-08-10 NOTE — Patient Instructions (Signed)
  Resume previous diet  Continue present medications  Repeat colonoscopy in 10 years for screening purposes   YOU HAD AN ENDOSCOPIC PROCEDURE TODAY AT THE [facility]:   Refer to the procedure report that was given to you for any specific questions about what was found during the examination.  If the procedure report does not answer your questions, please call your gastroenterologist to clarify.  If you requested that your care partner not be given the details of your procedure findings, then the procedure report has been included in a sealed envelope for you to review at your convenience later.  YOU SHOULD EXPECT: Some feelings of bloating in the abdomen. Passage of more gas than usual.  Walking can help get rid of the air that was put into your GI tract during the procedure and reduce the bloating. If you had a lower endoscopy (such as a colonoscopy or flexible sigmoidoscopy) you may notice spotting of blood in your stool or on the toilet paper. If you underwent a bowel prep for your procedure, you may not have a normal bowel movement for a few days.  Please Note:  You might notice some irritation and congestion in your nose or some drainage.  This is from the oxygen used during your procedure.  There is no need for concern and it should clear up in a day or so.  SYMPTOMS TO REPORT IMMEDIATELY:  Following lower endoscopy (colonoscopy or flexible sigmoidoscopy):  Excessive amounts of blood in the stool  Significant tenderness or worsening of abdominal pains  Swelling of the abdomen that is new, acute  Fever of 100F or higher  For urgent or emergent issues, a gastroenterologist can be reached at any hour by calling [phone]. Do not use MyChart messaging for urgent concerns.    DIET:  We do recommend a small meal at first, but then you may proceed to your regular diet.  Drink plenty of fluids but you should avoid alcoholic beverages for 24 hours.  ACTIVITY:  You should  plan to take it easy for the rest of today and you should NOT DRIVE or use heavy machinery until tomorrow (because of the sedation medicines used during the test).    FOLLOW UP: Our staff will call the number listed on your records the next business day following your procedure.  We will call around 7:15- 8:00 am to check on you and address any questions or concerns that you may have regarding the information given to you following your procedure. If we do not reach you, we will leave a message.     If any biopsies were taken you will be contacted by phone or by letter within the next 1-3 weeks.  Please call us at [phone] if you have not heard about the biopsies in 3 weeks.    SIGNATURES/CONFIDENTIALITY: You and/or your care partner have signed paperwork which will be entered into your electronic medical record.  These signatures attest to the fact that that the information above on your After Visit Summary has been reviewed and is understood.  Full responsibility of the confidentiality of this discharge information lies with you and/or your care-partner.

## 2024-08-10 NOTE — Progress Notes (Signed)
 Pt's states no medical or surgical changes since previsit or office visit.

## 2024-08-10 NOTE — Progress Notes (Signed)
 Sedate, gd SR, tolerated procedure well, VSS, report to RN

## 2024-08-11 ENCOUNTER — Telehealth: Payer: Self-pay

## 2024-08-11 NOTE — Telephone Encounter (Signed)
  Follow up Call-     08/10/2024    3:13 PM  Call back number  Post procedure Call Back phone  # [phone]  Permission to leave phone message Yes    Attempted to call regarding follow-up. No answer, VM left.

## 2024-08-18 ENCOUNTER — Other Ambulatory Visit (HOSPITAL_COMMUNITY): Payer: Self-pay

## 2024-08-18 DIAGNOSIS — N951 Menopausal and female climacteric states: Secondary | ICD-10-CM | POA: Diagnosis not present

## 2024-08-18 DIAGNOSIS — Z1231 Encounter for screening mammogram for malignant neoplasm of breast: Secondary | ICD-10-CM | POA: Diagnosis not present

## 2024-08-18 DIAGNOSIS — L680 Hirsutism: Secondary | ICD-10-CM | POA: Diagnosis not present

## 2024-08-18 DIAGNOSIS — Z01419 Encounter for gynecological examination (general) (routine) without abnormal findings: Secondary | ICD-10-CM | POA: Diagnosis not present

## 2024-08-18 DIAGNOSIS — Z1331 Encounter for screening for depression: Secondary | ICD-10-CM | POA: Diagnosis not present

## 2024-08-18 DIAGNOSIS — Z01411 Encounter for gynecological examination (general) (routine) with abnormal findings: Secondary | ICD-10-CM | POA: Diagnosis not present

## 2024-08-18 MED ORDER — VENLAFAXINE HCL ER 37.5 MG PO CP24
37.5000 mg | ORAL_CAPSULE | Freq: Every day | ORAL | 3 refills | Status: AC
  Filled 2024-08-18: qty 90, 90d supply, fill #0

## 2024-08-18 MED ORDER — ESTRADIOL 0.75 MG/0.75GM TD GEL
1.0000 | Freq: Every day | TRANSDERMAL | 3 refills | Status: AC
  Filled 2024-08-18: qty 90, 90d supply, fill #0
  Filled 2024-08-19: qty 30, 30d supply, fill #0
  Filled 2024-12-04: qty 30, 30d supply, fill #1

## 2024-08-19 ENCOUNTER — Other Ambulatory Visit (HOSPITAL_COMMUNITY): Payer: Self-pay

## 2024-08-21 ENCOUNTER — Other Ambulatory Visit (HOSPITAL_COMMUNITY): Payer: Self-pay

## 2024-08-22 ENCOUNTER — Other Ambulatory Visit (HOSPITAL_COMMUNITY): Payer: Self-pay

## 2024-09-01 ENCOUNTER — Other Ambulatory Visit (HOSPITAL_COMMUNITY): Payer: Self-pay

## 2024-09-01 MED ORDER — FLUZONE 0.5 ML IM SUSY
PREFILLED_SYRINGE | INTRAMUSCULAR | 0 refills | Status: AC
  Filled 2024-09-01: qty 0.5, 1d supply, fill #0

## 2024-09-21 ENCOUNTER — Encounter: Payer: Self-pay | Admitting: Physician Assistant

## 2024-09-21 ENCOUNTER — Telehealth: Admitting: Physician Assistant

## 2024-09-21 VITALS — BP 129/83 | HR 87 | Wt 173.0 lb

## 2024-09-21 DIAGNOSIS — N3000 Acute cystitis without hematuria: Secondary | ICD-10-CM

## 2024-09-21 LAB — POCT URINALYSIS DIP (CLINITEK)
Bilirubin, UA: NEGATIVE
Blood, UA: NEGATIVE
Glucose, UA: NEGATIVE mg/dL
Ketones, POC UA: NEGATIVE mg/dL
Leukocytes, UA: NEGATIVE
Nitrite, UA: NEGATIVE
Spec Grav, UA: 1.025 (ref 1.010–1.025)
Urobilinogen, UA: 0.2 U/dL
pH, UA: 6 (ref 5.0–8.0)

## 2024-09-21 MED ORDER — SULFAMETHOXAZOLE-TRIMETHOPRIM 800-160 MG PO TABS: 1.0000 | ORAL_TABLET | Freq: Two times a day (BID) | ORAL | 0 refills | Status: AC

## 2024-09-21 NOTE — Progress Notes (Signed)
 New Patient Office Visit  Subjective    Patient ID: [name], female    DOB: [birth year]  Age: 53 y.o. MRN: [id number]  CC:  Chief Complaint  Patient presents with   Urinary Tract Infection   Patient was seen virtually by provider via video visit due to provider working at home office.  Patient was evaluated by nursing staff on mobile unit  Discussed the use of AI scribe software for clinical note transcription with the patient, who gave verbal consent to proceed.  History of Present Illness   [name] is a 53 year old female who presents with symptoms of a urinary tract infection.  She began experiencing symptoms yesterday morning with constant pain in the right lower back near the kidney area. Her urine appeared cloudy yesterday. She has experienced recurrent urinary tract infections since a hysterectomy in November of last year. Her last UTI was in March, treated successfully with Pyridium  and Bactrim .  There is no burning during urination or increased frequency. She attributes the lack of increased frequency to low fluid intake, often going all day without drinking. She does not urinate at night, preferring to hold it until morning.  She has not taken antibiotics in the last 30 days and avoids over-the-counter pain relief for her current pain, preferring to avoid medication.    Outpatient Encounter Medications as of 09/21/2024  Medication Sig   amLODipine  (NORVASC ) 5 MG tablet Take 1 tablet (5 mg total) by mouth daily.   Estradiol  (DIVIGEL ) 0.75 MG/0.75GM GEL Place 1 packet onto the skin daily.   sulfamethoxazole -trimethoprim  (BACTRIM  DS) 800-160 MG tablet Take 1 tablet by mouth 2 (two) times daily for 3 days.   venlafaxine  XR (EFFEXOR -XR) 37.5 MG 24 hr capsule Take 1 capsule (37.5 mg total) by mouth daily.   influenza vac split trivalent PF (FLUZONE) 0.5 ML injection Inject into the muscle.   [DISCONTINUED] norethindrone  (AYGESTIN ) 5 MG tablet Take 1 tablet  (5 mg total) by mouth daily.   No facility-administered encounter medications on file as of 09/21/2024.    Past Medical History:  Diagnosis Date   Abnormal Pap smear    Anemia    Endometriosis    Fibroids    Hypertension    Ovarian cyst     Past Surgical History:  Procedure Laterality Date    diagnostic laparscopic  06/2004   CHROMOPERTUBATION N/A 11/28/2013   Procedure: CHROMOPERTUBATION, ;  Surgeon: Charlie JINNY Flowers, MD;  Location: [facility];  Service: Gynecology;  Laterality: N/A;   DIAGNOSTIC LAPAROSCOPY  2005   [place]    DILATATION & CURETTAGE/HYSTEROSCOPY WITH MYOSURE N/A 12/30/2018   Procedure: DILATATION & CURETTAGE/HYSTEROSCOPY WITH MYOSURE;  Surgeon: Flowers Charlie, MD;  Location: [facility];  Service: Gynecology;  Laterality: N/A;   HYSTERECTOMY, TOTAL, LAPAROSCOPIC, ROBOT-ASSISTED WITH SALPINGECTOMY N/A    HYSTEROSCOPY WITH NOVASURE N/A 12/30/2018   Procedure: HYSTEROSCOPY WITH NOVASURE, POSSIBLE HYDROTHERMAL ABLATION;  Surgeon: Flowers Charlie, MD;  Location: [facility];  Service: Gynecology;  Laterality: N/A;   OVARIAN CYST REMOVAL  2015   ROBOTIC ASSISTED LAPAROSCOPIC LYSIS OF ADHESION N/A 11/28/2013   Procedure: ROBOTIC ASSISTED LAPAROSCOPIC LYSIS OF ADHESION; EXCISION OF RIGHT OVARIAN CYST WALL AND MYOMECTOMY;  Surgeon: Charlie JINNY Flowers, MD;  Location: [facility];  Service: Gynecology;  Laterality: N/A;   UTERINE FIBROID SURGERY  2015   WISDOM TOOTH EXTRACTION      Family History  Problem Relation Age of Onset   Hypertension Mother    Stroke Mother  Hypertension Father    Heart disease Father    Stroke Maternal Grandfather    Hypertension Maternal Grandfather    Cancer Paternal Grandmother        stomach cancer   Hyperlipidemia Paternal Grandmother     Social History   Socioeconomic History   Marital status: Married    Spouse name: Not on file   Number of children: Not on file   Years of education: Not on file   Highest education level: Not on file   Occupational History   Not on file  Tobacco Use   Smoking status: Never   Smokeless tobacco: Never  Vaping Use   Vaping status: Never Used  Substance and Sexual Activity   Alcohol use: No   Drug use: No   Sexual activity: Yes    Birth control/protection: None  Other Topics Concern   Not on file  Social History Narrative   Not on file   Social Drivers of Health   Financial Resource Strain: Not on file  Food Insecurity: Not on file  Transportation Needs: Not on file  Physical Activity: Not on file  Stress: Not on file  Social Connections: Not on file  Intimate Partner Violence: Not on file    Review of Systems  Constitutional:  Negative for chills and fever.  HENT: Negative.    Eyes: Negative.   Respiratory:  Negative for shortness of breath.   Cardiovascular:  Negative for chest pain.  Gastrointestinal:  Negative for nausea and vomiting.  Genitourinary:  Negative for dysuria and frequency.  Musculoskeletal:  Positive for back pain.  Skin: Negative.   Neurological: Negative.   Endo/Heme/Allergies: Negative.   Psychiatric/Behavioral: Negative.          Objective    BP 129/83 (BP Location: Left Arm, Patient Position: Sitting, Cuff Size: Normal)   Pulse 87   Wt 173 lb (78.5 kg)   LMP 10/31/2019 (Within Weeks) Comment: on Lupron   SpO2 99%   BMI 27.10 kg/m   Physical Exam  Vital signs reviewed.  No physical exam completed due to nature visit    Assessment & Plan:   Problem List Items Addressed This Visit   None Visit Diagnoses       Suspected UTI    -  Primary   Relevant Medications   sulfamethoxazole -trimethoprim  (BACTRIM  DS) 800-160 MG tablet   Other Relevant Orders   POCT URINALYSIS DIP (CLINITEK) (Completed)     Acute cystitis without hematuria          1. Acute cystitis without hematuria (Primary) Reasonable to treat based on clinical presentation.  UA negative for urinary tract infection.  Trial Bactrim .  Patient education given on  supportive care, will send off for urine culture as well.  Red flags given for prompt reevaluation. - POCT URINALYSIS DIP (CLINITEK) - sulfamethoxazole -trimethoprim  (BACTRIM  DS) 800-160 MG tablet; Take 1 tablet by mouth 2 (two) times daily for 3 days.  Dispense: 6 tablet; Refill: 0    I have reviewed the patient's medical history (PMH, PSH, Social History, Family History, Medications, and allergies) , and have been updated if relevant. I spent 30 minutes reviewing chart and  face to face time with patient.   Return if symptoms worsen or fail to improve.   Kirk RAMAN Mayers, PA-C

## 2024-09-21 NOTE — Patient Instructions (Signed)
 VISIT SUMMARY:  You came in today with symptoms of a urinary tract infection, including constant pain in your right lower back and cloudy urine. You have a history of recurrent UTIs since your hysterectomy last November. Your last UTI was in March and was successfully treated with Pyridium  and Bactrim . You mentioned that you often go all day without drinking fluids and do not urinate at night.  YOUR PLAN:  -URINARY TRACT INFECTION: A urinary tract infection (UTI) is an infection in any part of your urinary system, including your kidneys, bladder, or urethra. Based on your symptoms, you have been prescribed antibiotics to treat the infection. A urine culture has been ordered to confirm the diagnosis. You are advised to increase your fluid intake to at least four bottles of water  per day to help prevent future infections. For pain management, you can take acetaminophen  or ibuprofen .  Urinary Tract Infection, Female A urinary tract infection (UTI) is an infection in your urinary tract. The urinary tract is made up of organs that make, store, and get rid of pee (urine) in your body. These organs include: The kidneys. The ureters. The bladder. The urethra. What are the causes? Most UTIs are caused by germs called bacteria. They may be in or near your genitals. These germs grow and cause swelling in your urinary tract. What increases the risk? You're more likely to get a UTI if: You're a female. The urethra is shorter in females than in males. You have a soft tube called a catheter that drains your pee. You can't control when you pee or poop. You have trouble peeing because of: A kidney stone. A urinary blockage. A nerve condition that affects your bladder. Not getting enough to drink. You're sexually active. You use a birth control inside your vagina, like spermicide. You're pregnant. You have low levels of the hormone estrogen in your body. You're an older adult. You're also more likely to  get a UTI if you have other health problems. These may include: Diabetes. A weak immune system. Your immune system is your body's defense system. Sickle cell disease. Injury of the spine. What are the signs or symptoms? Symptoms may include: Needing to pee right away. Peeing small amounts often. Pain or burning when you pee. Blood in your pee. Pee that smells bad or odd. Pain in your belly or lower back. You may also: Feel confused. This may be the first symptom in older adults. Vomit. Not feel hungry. Feel tired or easily annoyed. Have a fever or chills. How is this diagnosed? A UTI is diagnosed based on your medical history and an exam. You may also have other tests. These may include: Pee tests. Blood tests. Tests for sexually transmitted infections (STIs). If you've had more than one UTI, you may need to have imaging studies done to find out why you keep getting them. How is this treated? A UTI can be treated by: Taking antibiotics or other medicines. Drinking enough fluid to keep your pee pale yellow. In rare cases, a UTI can cause a very bad condition called sepsis. Sepsis may be treated in the hospital. Follow these instructions at home: Medicines Take your medicines only as told by your health care provider. If you were given antibiotics, take them as told by your provider. Do not stop taking them even if you start to feel better. General instructions Make sure you: Pee often and fully. Do not hold your pee for a long time. Wipe from front to back after  you pee or poop. Use each tissue only once when you wipe. Pee after you have sex. Do not douche or use sprays or powders in your genital area. Contact a health care provider if: Your symptoms don't get better after 1-2 days of taking antibiotics. Your symptoms go away and then come back. You have a fever or chills. You vomit or feel like you may vomit. Get help right away if: You have very bad pain in your back or  lower belly. You faint. This information is not intended to replace advice given to you by your health care provider. Make sure you discuss any questions you have with your health care provider. Document Revised: 10/27/2023 Document Reviewed: 02/19/2023 Elsevier Patient Education  2025 [employer].

## 2024-09-22 ENCOUNTER — Other Ambulatory Visit (HOSPITAL_COMMUNITY): Payer: Self-pay

## 2024-09-24 LAB — URINE CULTURE

## 2024-09-25 ENCOUNTER — Ambulatory Visit: Payer: Self-pay | Admitting: Physician Assistant

## 2024-09-25 NOTE — Progress Notes (Signed)
 Pt has been notified of test results and providers recommendations

## 2024-10-05 ENCOUNTER — Ambulatory Visit
Admission: EM | Admit: 2024-10-05 | Discharge: 2024-10-05 | Disposition: A | Discharge status: Home / Self Care | Attending: Family Medicine | Admitting: Family Medicine

## 2024-10-05 DIAGNOSIS — M545 Low back pain, unspecified: Secondary | ICD-10-CM | POA: Diagnosis not present

## 2024-10-05 MED ORDER — KETOROLAC TROMETHAMINE 30 MG/ML IJ SOLN
30.0000 mg | Freq: Once | INTRAMUSCULAR | Status: AC
  Administered 2024-10-05: 30 mg via INTRAMUSCULAR

## 2024-10-05 MED ORDER — METHOCARBAMOL 500 MG PO TABS: 500.0000 mg | ORAL_TABLET | Freq: Two times a day (BID) | ORAL | 0 refills | Status: AC | PRN

## 2024-10-05 NOTE — ED Provider Notes (Addendum)
 [facility]    CSN: [id number] Arrival date & time: 10/05/24  1754      History   Chief Complaint Chief Complaint  Patient presents with   Back Pain    HPI [name] is a 53 y.o. female presents for back pain.  Patient reports 2 weeks of a persistent bilateral lower back pain that does not radiate.  States pain only occurs with movement or certain positions.  Denies any numbness/tingling/weakness of her lower extremities, no bowel or bladder incontinence, no saddle paresthesias.  No known injury or inciting event.  No history of surgeries on her back but she does states she has mild scoliosis.  She states she thought it was a UTI but was checked for this already and that was negative.  She has been taking Tylenol  OTC for symptoms.  No other concerns at this time   Back Pain   Past Medical History:  Diagnosis Date   Abnormal Pap smear    Anemia    Endometriosis    Fibroids    Hypertension    Ovarian cyst     Patient Active Problem List   Diagnosis Date Noted   Dental infection 11/21/2020   Essential hypertension 08/31/2019   Well adult exam 05/13/2018   Iron  deficiency anemia 04/03/2016    Past Surgical History:  Procedure Laterality Date    diagnostic laparscopic  06/2004   CHROMOPERTUBATION N/A 11/28/2013   Procedure: CHROMOPERTUBATION, ;  Surgeon: Charlie JINNY Flowers, MD;  Location: [facility];  Service: Gynecology;  Laterality: N/A;   DIAGNOSTIC LAPAROSCOPY  2005   [place]    DILATATION & CURETTAGE/HYSTEROSCOPY WITH MYOSURE N/A 12/30/2018   Procedure: DILATATION & CURETTAGE/HYSTEROSCOPY WITH MYOSURE;  Surgeon: Flowers Charlie, MD;  Location: [facility];  Service: Gynecology;  Laterality: N/A;   HYSTERECTOMY, TOTAL, LAPAROSCOPIC, ROBOT-ASSISTED WITH SALPINGECTOMY N/A    HYSTEROSCOPY WITH NOVASURE N/A 12/30/2018   Procedure: HYSTEROSCOPY WITH NOVASURE, POSSIBLE HYDROTHERMAL ABLATION;  Surgeon: Flowers Charlie, MD;  Location: [facility];  Service:  Gynecology;  Laterality: N/A;   OVARIAN CYST REMOVAL  2015   ROBOTIC ASSISTED LAPAROSCOPIC LYSIS OF ADHESION N/A 11/28/2013   Procedure: ROBOTIC ASSISTED LAPAROSCOPIC LYSIS OF ADHESION; EXCISION OF RIGHT OVARIAN CYST WALL AND MYOMECTOMY;  Surgeon: Charlie JINNY Flowers, MD;  Location: [facility];  Service: Gynecology;  Laterality: N/A;   UTERINE FIBROID SURGERY  2015   WISDOM TOOTH EXTRACTION      OB History     Gravida  0   Para  0   Term  0   Preterm  0   AB  0   Living  0      SAB  0   IAB  0   Ectopic  0   Multiple  0   Live Births  0            Home Medications    Prior to Admission medications   Medication Sig Start Date End Date Taking? Authorizing Provider  methocarbamol (ROBAXIN) 500 MG tablet Take 1 tablet (500 mg total) by mouth 2 (two) times daily as needed for muscle spasms. 10/05/24  Yes Adon Gehlhausen, Jodi R, NP  amLODipine  (NORVASC ) 5 MG tablet Take 1 tablet (5 mg total) by mouth daily. 02/24/24   Alvia Bring, DO  Estradiol  (DIVIGEL ) 0.75 MG/0.75GM GEL Place 1 packet onto the skin daily. 08/18/24     influenza vac split trivalent PF (FLUZONE) 0.5 ML injection Inject into the muscle. 09/01/24   Luiz Channel, MD  venlafaxine  XR (EFFEXOR -XR) 37.5 MG 24 hr capsule Take 1 capsule (37.5 mg total) by mouth daily. 08/18/24     norethindrone  (AYGESTIN ) 5 MG tablet Take 1 tablet (5 mg total) by mouth daily. 04/02/23 08/12/23      Family History Family History  Problem Relation Age of Onset   Hypertension Mother    Stroke Mother    Hypertension Father    Heart disease Father    Stroke Maternal Grandfather    Hypertension Maternal Grandfather    Cancer Paternal Grandmother        stomach cancer   Hyperlipidemia Paternal Grandmother     Social History Social History   Tobacco Use   Smoking status: Never   Smokeless tobacco: Never  Vaping Use   Vaping status: Never Used  Substance Use Topics   Alcohol use: No   Drug use: No     Allergies   Patient has  no known allergies.   Review of Systems Review of Systems  Musculoskeletal:  Positive for back pain.     Physical Exam Triage Vital Signs ED Triage Vitals  Encounter Vitals Group     BP 10/05/24 1823 139/85     Girls Systolic BP Percentile --      Girls Diastolic BP Percentile --      Boys Systolic BP Percentile --      Boys Diastolic BP Percentile --      Pulse Rate 10/05/24 1823 75     Resp 10/05/24 1826 16     Temp 10/05/24 1826 (!) 97.5 F (36.4 C)     Temp Source 10/05/24 1826 Oral     SpO2 10/05/24 1823 97 %     Weight --      Height --      Head Circumference --      Peak Flow --      Pain Score 10/05/24 1823 7     Pain Loc --      Pain Education --      Exclude from Growth Chart --    No data found.  Updated Vital Signs BP 139/85 (BP Location: Right Arm)   Pulse 75   Temp (!) 97.5 F (36.4 C) (Oral)   Resp 16   LMP 10/31/2019 (Within Weeks) Comment: on Lupron   SpO2 97%   Visual Acuity Right Eye Distance:   Left Eye Distance:   Bilateral Distance:    Right Eye Near:   Left Eye Near:    Bilateral Near:     Physical Exam Vitals and nursing note reviewed.  Constitutional:      General: She is not in acute distress.    Appearance: Normal appearance. She is not ill-appearing.  HENT:     Head: Normocephalic and atraumatic.  Eyes:     Pupils: Pupils are equal, round, and reactive to light.  Cardiovascular:     Rate and Rhythm: Normal rate.  Pulmonary:     Effort: Pulmonary effort is normal.  Abdominal:     Tenderness: There is no right CVA tenderness or left CVA tenderness.  Musculoskeletal:     Thoracic back: No bony tenderness.     Lumbar back: No swelling, edema, deformity, signs of trauma, lacerations, spasms, tenderness or bony tenderness. Normal range of motion. Negative right straight leg raise test and negative left straight leg raise test. No scoliosis.       Back:     Comments: There is no tenderness with palpation to bilateral lower  paraspinal muscles but this is the area patient reports pain with certain movements.  Strength is 5 out of 5 bilateral lower extremity  Skin:    General: Skin is warm and dry.  Neurological:     General: No focal deficit present.     Mental Status: She is alert and oriented to person, place, and time.  Psychiatric:        Mood and Affect: Mood normal.        Behavior: Behavior normal.      UC Treatments / Results  Labs (all labs ordered are listed, but only abnormal results are displayed) Labs Reviewed - No data to display  CMP14+EGFR Order: [id number]  Status: Final result     Next appt: None     Dx: Essential hypertension; Well adult exam   Test Result Released: Yes (seen)     Messages: Seen   0 Result Notes     1 Patient Communication     View Follow-Up Encounter          Component Ref Range & Units (hover) 5 mo ago (04/18/24) 1 yr ago (07/27/23) 3 yr ago (04/20/21) 5 yr ago (07/19/19) 5 yr ago (12/30/18) 6 yr ago (05/13/18) 8 yr ago (09/06/16)  Glucose 100 High  66 Low  75 CM 80 80 88 88 R  BUN 9 10 11  R 9 R 9 R 13 R 8 R  Creatinine, Ser 0.62 0.71 0.70 R 0.69 R 0.66 R 0.71 R 0.59 R  eGFR 106 102       BUN/Creatinine Ratio 15 14       Sodium 140 142 139 R 140 R 136 R 141 R 140 R  Potassium 3.8 3.9 3.2 Low  R 3.6 R 3.5 R 4.4 R 3.2 Low  R  Chloride 102 105 104 R 107 R 108 R 108 R 107 R  CO2 23 23 23  R 22 R 22 R 26 R 22 R  Calcium 9.5 9.2 9.0 R 9.4 R 9.0 R 9.7 R 9.4 R  Total Protein 6.9 6.8  7.3 R  7.2 R 7.5 R  Albumin 4.3 4.3  4.2 R  4.2 R 4.2 R  Globulin, Total 2.6 2.5       Bilirubin Total 0.4 0.3  0.6 R  0.4 R 0.3 R  Alkaline Phosphatase 123 High  81  75 R  61 R 66 R  AST 22 19  26  R  18 R 25 R  ALT 24 10  30  R  15 R 21 R  Resulting Agency [facility] [employer] [facility]         Narrative Performed by: HOYT Performed at:  [address] [facility] [address] [place], [place]  [id number] Lab  Director: Frankey Sas MD, Phone:  [phone]  Specimen Collected: 04/18/24 12:12 Last Resulted: 04/19/24 07:36    EKG   Radiology No results found.  Procedures Procedures (including critical care time)  Medications Ordered in UC Medications  ketorolac  (TORADOL ) 30 MG/ML injection 30 mg (has no administration in time range)    Initial Impression / Assessment and Plan / UC Course  I have reviewed the triage vital signs and the nursing notes.  Pertinent labs & imaging results that were available during my care of the patient were reviewed by me and considered in my medical decision making (see chart for details).     Reviewed exam and symptoms with  patient.  No red flags.  Patient given Toradol  in clinic, monitored for 10 minutes with no reaction noted and tolerated well.  No NSAIDs for 24 hours and verbalized understanding.  Will do trial of Robaxin, side effect profile reviewed.  Discussed heat rest and PCP follow-up if symptoms do not improve.  ER precautions reviewed Final Clinical Impressions(s) / UC Diagnoses   Final diagnoses:  Acute bilateral low back pain without sciatica     Discharge Instructions      You were given a Toradol  injection in clinic today. Do not take any over the counter NSAID's such as Advil , ibuprofen , Aleve , or naproxen  for 24 hours. You may take tylenol  if needed You may start Robaxin twice daily as needed.  Please note this will make you tired.  Do not drink alcohol or drive on this medication.  Heat to the low back and lots of rest.  Please follow-up with your PCP if your symptoms do not improve.  Please go to the ER if you develop any worsening symptoms.  Hope you feel better soon!      ED Prescriptions     Medication Sig Dispense Auth. Provider   methocarbamol (ROBAXIN) 500 MG tablet Take 1 tablet (500 mg total) by mouth 2 (two) times daily as needed for muscle spasms. 10 tablet Brynlie Daza, Jodi R, NP      PDMP not reviewed this  encounter.   Sabrina Myla SAUNDERS, NP 10/05/24 1847    Sabrina Myla SAUNDERS, NP 10/05/24 [phone]

## 2024-10-05 NOTE — Discharge Instructions (Addendum)
 You were given a Toradol  injection in clinic today. Do not take any over the counter NSAID's such as Advil , ibuprofen , Aleve , or naproxen  for 24 hours. You may take tylenol  if needed You may start Robaxin twice daily as needed.  Please note this will make you tired.  Do not drink alcohol or drive on this medication.  Heat to the low back and lots of rest.  Please follow-up with your PCP if your symptoms do not improve.  Please go to the ER if you develop any worsening symptoms.  Hope you feel better soon!

## 2024-10-05 NOTE — ED Triage Notes (Signed)
 Pt present with c/o back pain x 2 weeks. States the pain does not radiate anywhere else.  Home interventions: Tylenol 

## 2024-12-04 ENCOUNTER — Other Ambulatory Visit (HOSPITAL_COMMUNITY): Payer: Self-pay

## 2024-12-04 DIAGNOSIS — I10 Essential (primary) hypertension: Secondary | ICD-10-CM

## 2024-12-04 MED ORDER — AMLODIPINE BESYLATE 5 MG PO TABS
5.0000 mg | ORAL_TABLET | Freq: Every day | ORAL | 0 refills | Status: AC
  Filled 2024-12-04: qty 90, 90d supply, fill #0

## 2024-12-05 ENCOUNTER — Other Ambulatory Visit: Payer: Self-pay
